# Patient Record
Sex: Female | Born: 1960 | Race: White | Hispanic: No | Marital: Married | Smoking: Never smoker
Health system: Southern US, Community
[De-identification: ages and names within clinical notes are randomized; demographics above are authoritative.]

## PROBLEM LIST (undated history)

## (undated) DIAGNOSIS — M5430 Sciatica, unspecified side: Secondary | ICD-10-CM

## (undated) DIAGNOSIS — E7800 Pure hypercholesterolemia, unspecified: Secondary | ICD-10-CM

## (undated) DIAGNOSIS — I10 Essential (primary) hypertension: Secondary | ICD-10-CM

## (undated) DIAGNOSIS — C801 Malignant (primary) neoplasm, unspecified: Secondary | ICD-10-CM

## (undated) DIAGNOSIS — E119 Type 2 diabetes mellitus without complications: Secondary | ICD-10-CM

## (undated) HISTORY — PX: ABDOMINAL HYSTERECTOMY: SHX81

## (undated) HISTORY — PX: LIVER SURGERY: SHX698

## (undated) HISTORY — PX: COLON SURGERY: SHX602

## (undated) HISTORY — PX: COLOSTOMY: SHX63

---

## 2002-01-02 ENCOUNTER — Emergency Department (HOSPITAL_COMMUNITY): Admission: EM | Admit: 2002-01-02 | Discharge: 2002-01-03 | Payer: Self-pay

## 2002-06-13 ENCOUNTER — Ambulatory Visit (HOSPITAL_COMMUNITY): Admission: RE | Admit: 2002-06-13 | Discharge: 2002-06-13 | Payer: Self-pay | Admitting: Internal Medicine

## 2002-06-13 ENCOUNTER — Encounter: Payer: Self-pay | Admitting: Internal Medicine

## 2004-01-09 ENCOUNTER — Ambulatory Visit (HOSPITAL_COMMUNITY): Admission: RE | Admit: 2004-01-09 | Discharge: 2004-01-09 | Payer: Self-pay | Admitting: Family Medicine

## 2006-03-24 ENCOUNTER — Ambulatory Visit (HOSPITAL_COMMUNITY): Admission: RE | Admit: 2006-03-24 | Discharge: 2006-03-24 | Payer: Self-pay | Admitting: Family Medicine

## 2007-06-02 ENCOUNTER — Ambulatory Visit (HOSPITAL_COMMUNITY): Admission: RE | Admit: 2007-06-02 | Discharge: 2007-06-02 | Payer: Self-pay | Admitting: Family Medicine

## 2008-09-07 IMAGING — CT CT ABDOMEN WO/W CM
2 of 5 series · 11 of 32 positions shown, 17 images · IV contrast (Omnipaque 300)
Comparison: CT 01/09/2004

CLINICAL DATA: Elevated LFTs.  History colon cancer with liver
metastasis.

CT ABDOMEN WITHOUT AND WITH CONTRAST,CT PELVIS WITH CONTRAST
TECHNIQUE: Multidetector CT imaging of the abdomen was performed
following the standard protocol before and during bolus
administration of intravenous contrast.,Technique:  Multidetector
CT imaging of the pelvis was performed following the standard proto
Contrast: 100 ml Hmnipaque-FPP

[Series 4: venous 60 sec 3.0 b40f · axial · portal-venous · 0.92mm/px · z∈[-472,-56]mm · 9 of 175 slices shown, 15 images]
[im 18/175  soft-tissue]
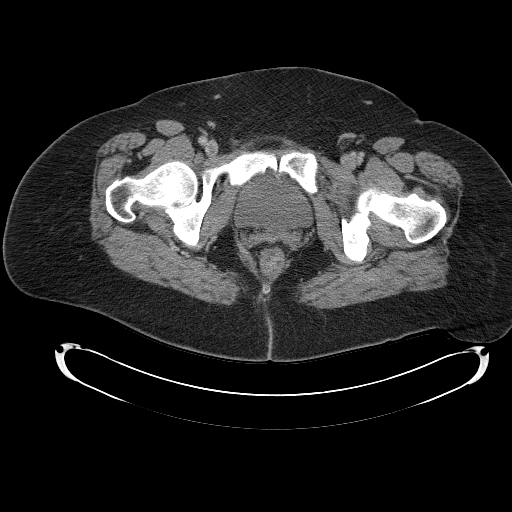
[im 18/175  bone]
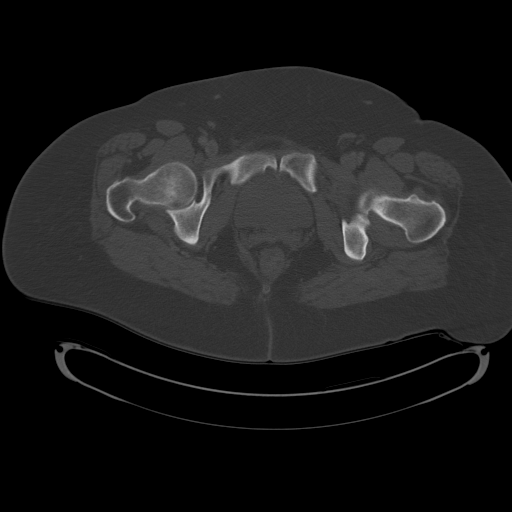
[im 35/175  soft-tissue]
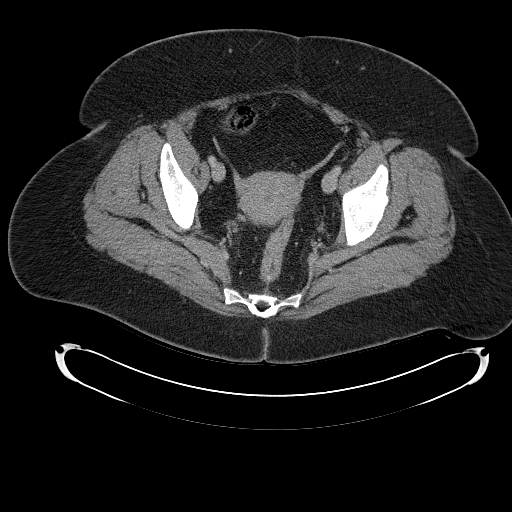
[im 53/175  soft-tissue]
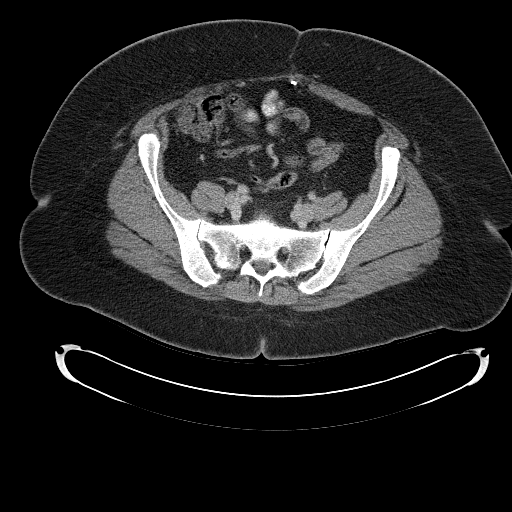
[im 70/175  soft-tissue]
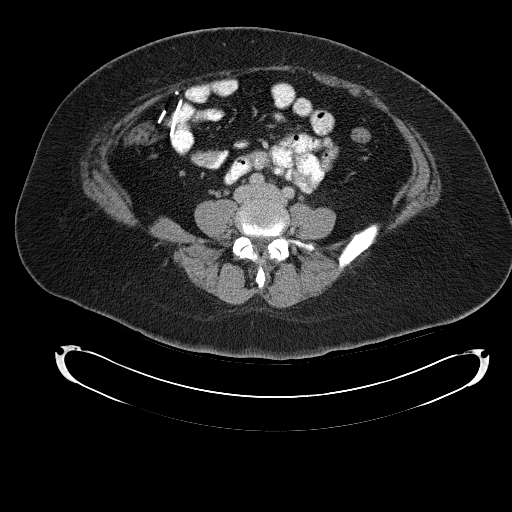
[im 88/175  soft-tissue]
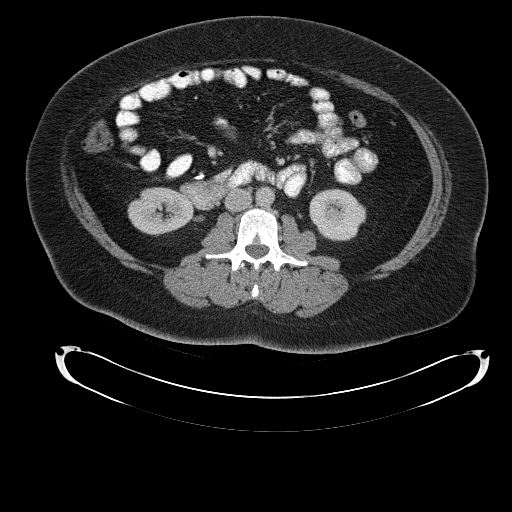
[im 105/175  soft-tissue]
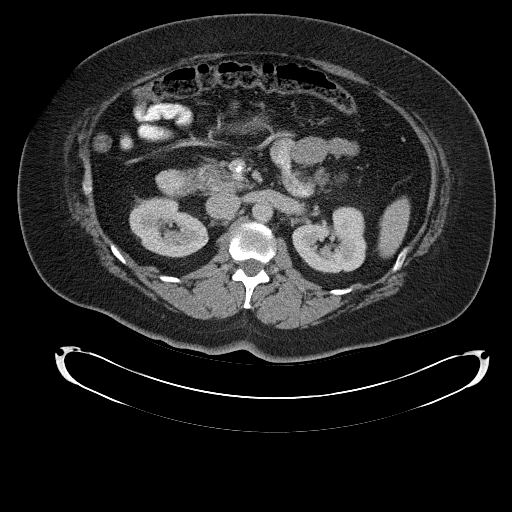
[im 105/175  lung]
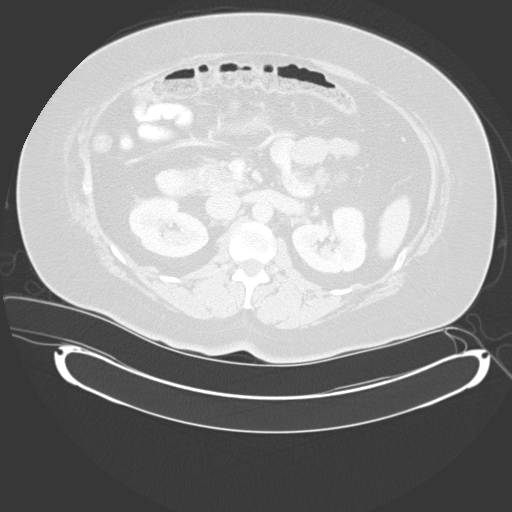
[im 122/175  soft-tissue]
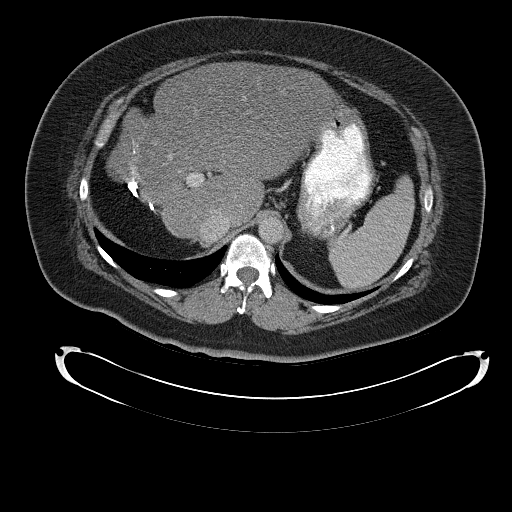
[im 122/175  lung]
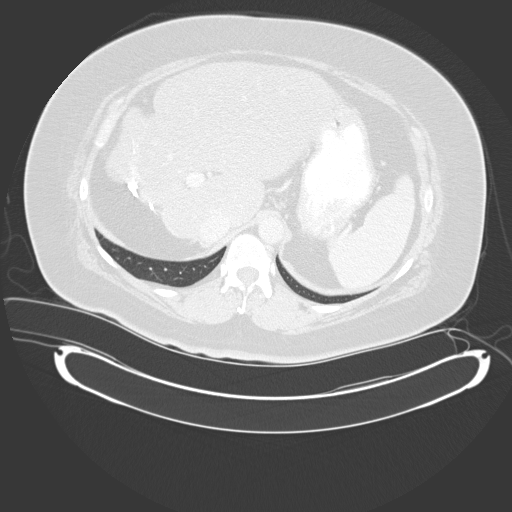
[im 140/175  soft-tissue]
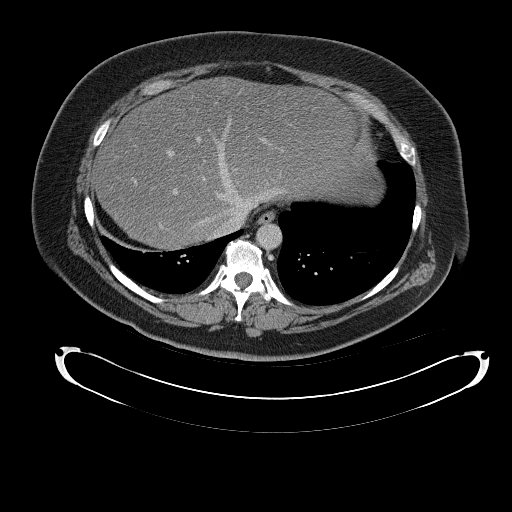
[im 140/175  lung]
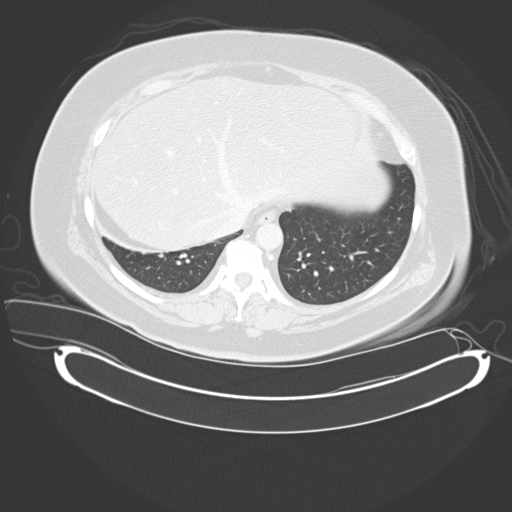
[im 157/175  soft-tissue]
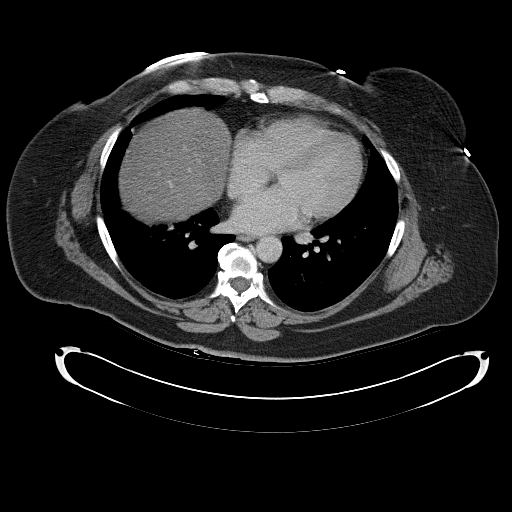
[im 157/175  lung]
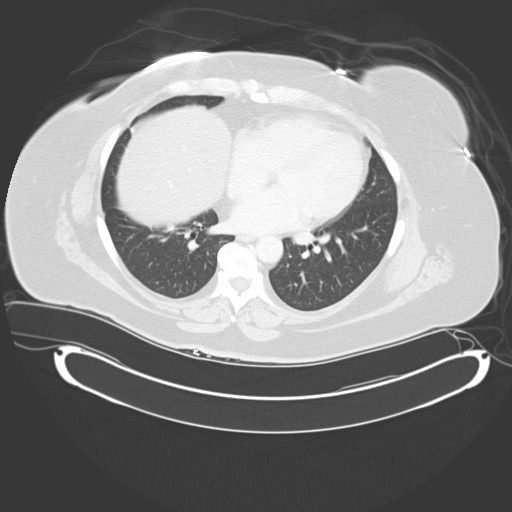
[im 157/175  bone]
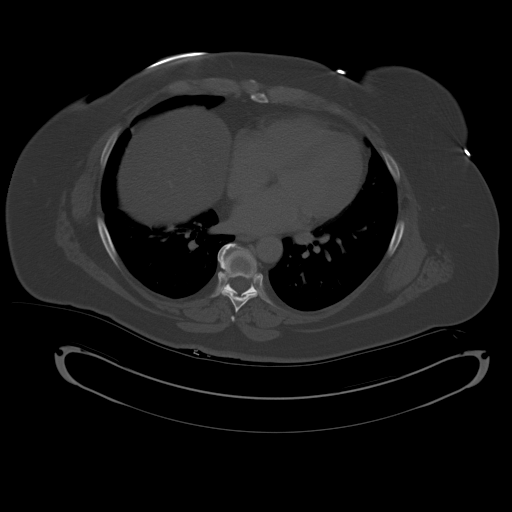

[Series 5: lung · axial · 0.92mm/px · z∈[-130,-68]mm · 2 of 65 slices shown]
[im 22/65  bone]
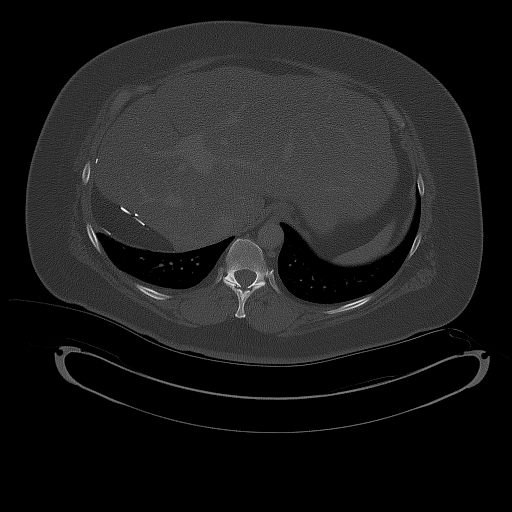
[im 43/65  bone]
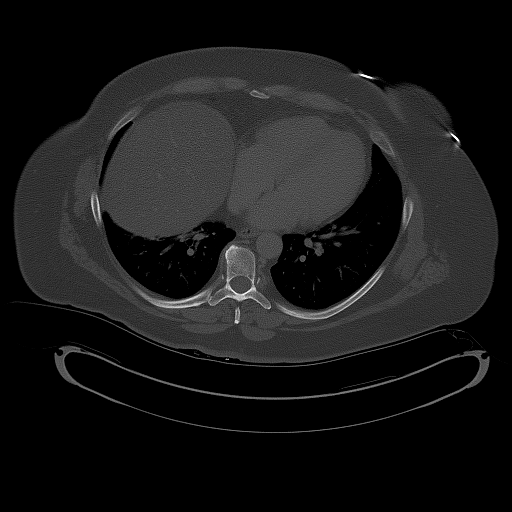

[11 of 32 positions shown; findings below may reference images not displayed]

FINDINGS: Postsurgical changes secondary to partial liver resection
redemonstrated.  No hepatic lesions are identified.  There is no
periportal or mesenteric adenopathy.  A biliary drainage catheter
is identified.  The pancreas is normal.  The spleen has a normal
appearance.  The kidneys show symmetric uptake and excretion of
contrast without abnormality.  The small and large bowel is
unremarkable.

The lung bases are clear.  No pericardial or pleural effusion.  The
osseous structures are unremarkable

CT pelvis:  Uterus is normal for age.  The ovaries are not
identified.  No pelvic adenopathy.  Sigmoid colon and rectum
unremarkable
IMPRESSION: 1.  CT abdomen: stable.  There is no evidence for hepatic
metastasis.  No adenopathy.

2.  CT pelvis:  Negative for metastatic disease.

## 2010-03-16 ENCOUNTER — Encounter: Payer: Self-pay | Admitting: Family Medicine

## 2012-01-02 ENCOUNTER — Other Ambulatory Visit: Payer: Self-pay | Admitting: Family Medicine

## 2012-01-02 DIAGNOSIS — Z1231 Encounter for screening mammogram for malignant neoplasm of breast: Secondary | ICD-10-CM

## 2012-01-02 DIAGNOSIS — Z780 Asymptomatic menopausal state: Secondary | ICD-10-CM

## 2012-02-11 ENCOUNTER — Ambulatory Visit: Payer: Self-pay

## 2012-02-11 ENCOUNTER — Other Ambulatory Visit: Payer: Self-pay

## 2012-06-01 ENCOUNTER — Other Ambulatory Visit: Payer: Self-pay

## 2012-06-01 ENCOUNTER — Ambulatory Visit: Payer: Self-pay

## 2013-04-14 ENCOUNTER — Encounter (HOSPITAL_COMMUNITY): Payer: Self-pay | Admitting: Emergency Medicine

## 2013-04-14 ENCOUNTER — Emergency Department (HOSPITAL_COMMUNITY)
Admission: EM | Admit: 2013-04-14 | Discharge: 2013-04-14 | Disposition: A | Discharge status: Home / Self Care | Payer: Self-pay | Attending: Emergency Medicine | Admitting: Emergency Medicine

## 2013-04-14 DIAGNOSIS — I10 Essential (primary) hypertension: Secondary | ICD-10-CM | POA: Insufficient documentation

## 2013-04-14 DIAGNOSIS — E119 Type 2 diabetes mellitus without complications: Secondary | ICD-10-CM | POA: Insufficient documentation

## 2013-04-14 DIAGNOSIS — M5430 Sciatica, unspecified side: Secondary | ICD-10-CM | POA: Insufficient documentation

## 2013-04-14 DIAGNOSIS — R739 Hyperglycemia, unspecified: Secondary | ICD-10-CM

## 2013-04-14 DIAGNOSIS — M545 Low back pain, unspecified: Secondary | ICD-10-CM | POA: Insufficient documentation

## 2013-04-14 DIAGNOSIS — Z859 Personal history of malignant neoplasm, unspecified: Secondary | ICD-10-CM | POA: Insufficient documentation

## 2013-04-14 HISTORY — DX: Malignant (primary) neoplasm, unspecified: C80.1

## 2013-04-14 HISTORY — DX: Type 2 diabetes mellitus without complications: E11.9

## 2013-04-14 HISTORY — DX: Essential (primary) hypertension: I10

## 2013-04-14 HISTORY — DX: Pure hypercholesterolemia, unspecified: E78.00

## 2013-04-14 LAB — CBG MONITORING, ED: GLUCOSE-CAPILLARY: 265 mg/dL — AB (ref 70–99)

## 2013-04-14 MED ORDER — KETOROLAC TROMETHAMINE 10 MG PO TABS
10.0000 mg | ORAL_TABLET | Freq: Once | ORAL | Status: AC
  Administered 2013-04-14: 10 mg via ORAL
  Filled 2013-04-14: qty 1

## 2013-04-14 MED ORDER — HYDROCODONE-ACETAMINOPHEN 5-325 MG PO TABS
1.0000 | ORAL_TABLET | Freq: Once | ORAL | Status: AC
  Administered 2013-04-14: 1 via ORAL
  Filled 2013-04-14: qty 1

## 2013-04-14 MED ORDER — METHYLPREDNISOLONE SODIUM SUCC 125 MG IJ SOLR: 125.0000 mg | Freq: Once | INTRAMUSCULAR | Status: DC

## 2013-04-14 MED ORDER — METHYLPREDNISOLONE SODIUM SUCC 125 MG IJ SOLR
125.0000 mg | Freq: Once | INTRAMUSCULAR | Status: AC
  Administered 2013-04-14: 125 mg via INTRAMUSCULAR
  Filled 2013-04-14: qty 2

## 2013-04-14 MED ORDER — DIAZEPAM 5 MG PO TABS
5.0000 mg | ORAL_TABLET | Freq: Once | ORAL | Status: AC
  Administered 2013-04-14: 5 mg via ORAL
  Filled 2013-04-14: qty 1

## 2013-04-14 MED ORDER — BACLOFEN 10 MG PO TABS: 10.0000 mg | ORAL_TABLET | Freq: Three times a day (TID) | ORAL | Status: AC

## 2013-04-14 MED ORDER — HYDROCODONE-ACETAMINOPHEN 5-325 MG PO TABS: 1.0000 | ORAL_TABLET | ORAL | Status: DC | PRN

## 2013-04-14 MED ORDER — ONDANSETRON HCL 4 MG PO TABS
4.0000 mg | ORAL_TABLET | Freq: Once | ORAL | Status: AC
  Administered 2013-04-14: 4 mg via ORAL
  Filled 2013-04-14: qty 1

## 2013-04-14 MED ORDER — MELOXICAM 7.5 MG PO TABS: ORAL_TABLET | ORAL | Status: AC

## 2013-04-14 NOTE — ED Provider Notes (Signed)
CSN: [id number]     Arrival date & time 04/14/13  1921 History   First MD Initiated Contact with Patient 04/14/13 1951     Chief Complaint  Patient presents with  . Leg Pain     (Consider location/radiation/quality/duration/timing/severity/associated sxs/prior Treatment) HPI Comments: Patient is a 53 year old female who presents to the emergency apartment with complaint of back pain and leg pain. The patient states that she is been diagnosed with" sciatica" by clinical examination for quite some time. 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 Over the last 1-1/2 weeks the pain has gotten progressively worse. In in the last 3 days it has gotten almost unbearable. The patient has problems controlling her bowels and bladder following: Surgery. She has not had any new changes in control of her bowels and bladder in this 1-1/2 week period.. The patient has not had any fall or injury to the back or legs. The patient has been given a list of physical therapy type exercises to do, but has not been sent to physical therapy, and according to the patient has not had x-rays or MRIs concerning her back.  Patient is a 53 y.o. female presenting with leg pain. The history is provided by the patient.  Leg Pain Associated symptoms: back pain   Associated symptoms: no neck pain     Past Medical History  Diagnosis Date  . Cancer   . Hypertension   . Diabetes mellitus without complication   . High cholesterol    Past Surgical History  Procedure Laterality Date  . Colon surgery    . Colostomy    . Liver surgery     No family history on file. History  Substance Use Topics  . Smoking status: Never Smoker   . Smokeless tobacco: Not on file  . Alcohol Use: No   OB History   Grav Para Term Preterm Abortions TAB SAB Ect Mult Living                 Review of Systems  Constitutional: Negative for activity change.       All ROS Neg  except as noted in HPI  HENT: Negative for nosebleeds.   Eyes: Negative for photophobia and discharge.  Respiratory: Negative for cough, shortness of breath and wheezing.   Cardiovascular: Negative for chest pain and palpitations.  Gastrointestinal: Negative for abdominal pain and blood in stool.  Genitourinary: Negative for dysuria, frequency and hematuria.  Musculoskeletal: Positive for back pain. Negative for arthralgias and neck pain.  Skin: Negative.   Neurological: Negative for dizziness, seizures and speech difficulty.  Psychiatric/Behavioral: Negative for hallucinations and confusion.      Allergies  Iohexol  Home Medications  No current outpatient prescriptions on file. BP 143/79  Pulse 73  Temp(Src) 98.2 F (36.8 C) (Oral)  Resp 18  Ht 5\' 1"  (1.549 m)  Wt 206 lb (93.441 kg)  BMI 38.94 kg/m2  SpO2 98% Physical Exam  Nursing note and vitals reviewed. Constitutional: She is oriented to person, place, and time. She appears well-developed and well-nourished.  Non-toxic appearance.  HENT:  Head: Normocephalic.  Right Ear: Tympanic membrane and external ear normal.  Left Ear: Tympanic membrane and external ear normal.  Eyes: EOM and lids are normal. Pupils are equal, round, and reactive to light.  Neck: Normal range of motion. Neck supple. Carotid bruit is not present.  Cardiovascular: Normal rate, regular rhythm, normal heart  sounds, intact distal pulses and normal pulses.   Pulmonary/Chest: Breath sounds normal. No respiratory distress.  Abdominal: Soft. Bowel sounds are normal. There is no tenderness. There is no guarding.  Musculoskeletal: Normal range of motion.  There is left paraspinal area tenderness to palpation and range of motion. There is no palpable step off appreciated of the lumbar spine area. No hot areas. Back pain aggravated by leg raises.  Lymphadenopathy:       Head (right side): No submandibular adenopathy present.       Head (left side): No  submandibular adenopathy present.    She has no cervical adenopathy.  Neurological: She is alert and oriented to person, place, and time. She has normal strength. No cranial nerve deficit or sensory deficit.  Gait is steady. Pt walks with a limp, but No foot drop.   Skin: Skin is warm and dry.  Psychiatric: She has a normal mood and affect. Her speech is normal.    ED Course  Procedures (including critical care time) Labs Review Labs Reviewed  CBG MONITORING, ED - Abnormal; Notable for the following:    Glucose-Capillary 265 (*)    All other components within normal limits   Imaging Review No results found.  EKG Interpretation   None       MDM Patient has a long-term history of back problems. She has been told by clinical examination that she has sciatica. The patient has been doing physical therapy exercises that she was given to use but these have not been successful. She has tried ibuprofen and this too has not been successful. The patient states now the pain is keeping her up at night and getting progressively worse. She attempted to contact her physician today, but did not get a call back.    Final diagnoses:  None    **I have reviewed nursing notes, vital signs, and all appropriate lab and imaging results for this patient.*  The plan at this time is for the patient to be treated with Norco, Mobic, Robaxin. Patient advised to apply heat to her lower back. She is to see her primary physician as sone as possible for additional evaluation and management of her lower back problems.  Lenox Ahr, PA-C 04/14/13 2031

## 2013-04-14 NOTE — ED Notes (Signed)
Pt also reports numbness in the toes of her L foot, denies same on R. EDPa informed of pt's CBG.

## 2013-04-14 NOTE — ED Notes (Signed)
Patient c/o left leg pain for about 1 1/2 weeks; states has gotten worse in the last 3 days.  Patient states she has history of sciatica, but everything she usually does for sciatica is not working.  Patient ambulatory to triage.

## 2013-04-14 NOTE — Discharge Instructions (Signed)
Please call Dr. Melford Aase tomorrow to set up an appointment as soon  as possible. Your glucose is elevated. Please discuss this with your doctor. It will be helpful to you to discuss your workup concerning your back problem with your Dr. This will also help the MD help you  with pain management. Please apply heat to your back. Please rest your back is much as possible. Please take your medications as prescribed. Baclofen and Norco may cause drowsiness, please use with caution. Back Pain, Adult Low back pain is very common. About 1 in 5 people have back pain.The cause of low back pain is rarely dangerous. The pain often gets better over time.About half of people with a sudden onset of back pain feel better in just 2 weeks. About 8 in 10 people feel better by 6 weeks.  CAUSES Some common causes of back pain include:  Strain of the muscles or ligaments supporting the spine.  Wear and tear (degeneration) of the spinal discs.  Arthritis.  Direct injury to the back. DIAGNOSIS Most of the time, the direct cause of low back pain is not known.However, back pain can be treated effectively even when the exact cause of the pain is unknown.Answering your caregiver's questions about your overall health and symptoms is one of the most accurate ways to make sure the cause of your pain is not dangerous. If your caregiver needs more information, he or she may order lab work or imaging tests (X-rays or MRIs).However, even if imaging tests show changes in your back, this usually does not require surgery. HOME CARE INSTRUCTIONS For many people, back pain returns.Since low back pain is rarely dangerous, it is often a condition that people can learn to [facility] their own.   Remain active. It is stressful on the back to sit or stand in one place. Do not sit, drive, or stand in one place for more than 30 minutes at a time. Take short walks on level surfaces as soon as pain allows.Try to increase the length of time you  walk each day.  Do not stay in bed.Resting more than 1 or 2 days can delay your recovery.  Do not avoid exercise or work.Your body is made to move.It is not dangerous to be active, even though your back may hurt.Your back will likely heal faster if you return to being active before your pain is gone.  Pay attention to your body when you bend and lift. Many people have less discomfortwhen lifting if they bend their knees, keep the load close to their bodies,and avoid twisting. Often, the most comfortable positions are those that put less stress on your recovering back.  Find a comfortable position to sleep. Use a firm mattress and lie on your side with your knees slightly bent. If you lie on your back, put a pillow under your knees.  Only take over-the-counter or prescription medicines as directed by your caregiver. Over-the-counter medicines to reduce pain and inflammation are often the most helpful.Your caregiver may prescribe muscle relaxant drugs.These medicines help dull your pain so you can more quickly return to your normal activities and healthy exercise.  Put ice on the injured area.  Put ice in a plastic bag.  Place a towel between your skin and the bag.  Leave the ice on for 15-20 minutes, 03-04 times a day for the first 2 to 3 days. After that, ice and heat may be alternated to reduce pain and spasms.  Ask your caregiver about trying back  exercises and gentle massage. This may be of some benefit.  Avoid feeling anxious or stressed.Stress increases muscle tension and can worsen back pain.It is important to recognize when you are anxious or stressed and learn ways to manage it.Exercise is a great option. SEEK MEDICAL CARE IF:  You have pain that is not relieved with rest or medicine.  You have pain that does not improve in 1 week.  You have new symptoms.  You are generally not feeling well. SEEK IMMEDIATE MEDICAL CARE IF:   You have pain that radiates from your  back into your legs.  You develop new bowel or bladder control problems.  You have unusual weakness or numbness in your arms or legs.  You develop nausea or vomiting.  You develop abdominal pain.  You feel faint. Document Released: 02/10/2005 Document Revised: 08/12/2011 Document Reviewed: 07/01/2010 [facility] Patient Information 2014 [place], [place].

## 2013-04-15 NOTE — ED Provider Notes (Signed)
Medical screening examination/treatment/procedure(s) were performed by non-physician practitioner and as supervising physician I was immediately available for consultation/collaboration.  EKG Interpretation   None        Jasper Riling. Alvino Chapel, MD 04/15/13 6578

## 2015-12-20 ENCOUNTER — Encounter: Payer: Self-pay | Admitting: Nurse Practitioner

## 2017-01-15 ENCOUNTER — Encounter (HOSPITAL_COMMUNITY): Payer: Self-pay

## 2017-01-15 ENCOUNTER — Emergency Department (HOSPITAL_COMMUNITY)
Admission: EM | Admit: 2017-01-15 | Discharge: 2017-01-15 | Disposition: A | Discharge status: Home / Self Care | Payer: BLUE CROSS/BLUE SHIELD | Attending: Emergency Medicine | Admitting: Emergency Medicine

## 2017-01-15 ENCOUNTER — Other Ambulatory Visit: Payer: Self-pay

## 2017-01-15 DIAGNOSIS — Z7984 Long term (current) use of oral hypoglycemic drugs: Secondary | ICD-10-CM | POA: Insufficient documentation

## 2017-01-15 DIAGNOSIS — I10 Essential (primary) hypertension: Secondary | ICD-10-CM | POA: Insufficient documentation

## 2017-01-15 DIAGNOSIS — M5431 Sciatica, right side: Secondary | ICD-10-CM | POA: Diagnosis not present

## 2017-01-15 DIAGNOSIS — Z79899 Other long term (current) drug therapy: Secondary | ICD-10-CM | POA: Diagnosis not present

## 2017-01-15 DIAGNOSIS — E119 Type 2 diabetes mellitus without complications: Secondary | ICD-10-CM | POA: Diagnosis not present

## 2017-01-15 DIAGNOSIS — M79604 Pain in right leg: Secondary | ICD-10-CM | POA: Diagnosis present

## 2017-01-15 MED ORDER — METHYLPREDNISOLONE 4 MG PO TBPK: ORAL_TABLET | ORAL | 0 refills | Status: DC

## 2017-01-15 MED ORDER — HYDROMORPHONE HCL 1 MG/ML IJ SOLN
1.0000 mg | Freq: Once | INTRAMUSCULAR | Status: AC
  Administered 2017-01-15: 1 mg via INTRAMUSCULAR
  Filled 2017-01-15: qty 1

## 2017-01-15 MED ORDER — DIAZEPAM 5 MG PO TABS
5.0000 mg | ORAL_TABLET | Freq: Once | ORAL | Status: AC
  Administered 2017-01-15: 5 mg via ORAL
  Filled 2017-01-15: qty 1

## 2017-01-15 MED ORDER — KETOROLAC TROMETHAMINE 30 MG/ML IJ SOLN
60.0000 mg | Freq: Once | INTRAMUSCULAR | Status: AC
Start: 2017-01-15 — End: 2017-01-15
  Administered 2017-01-15: 60 mg via INTRAMUSCULAR
  Filled 2017-01-15: qty 2

## 2017-01-15 MED ORDER — METHOCARBAMOL 500 MG PO TABS: 500.0000 mg | ORAL_TABLET | Freq: Two times a day (BID) | ORAL | 0 refills | Status: DC

## 2017-01-15 NOTE — ED Notes (Signed)
Pt stated that her pain meds are not touching the pain.

## 2017-01-15 NOTE — Discharge Instructions (Signed)
Take your pain medicine as prescribed.  Monitor blood sugars closely while you are taking the steroids.  Follow-up with your primary doctor.  He likely should have an MRI of your lumbar spine.  Return to the ED if you develop new or worsening symptoms including weakness, numbness or incontinence or any other concerns

## 2017-01-15 NOTE — ED Notes (Signed)
Pt has a driver at bedside for transportation. Pt verbalizing understanding that driver is needed to be discharged d/t valium and medication. Driver verbalizing that he will drive.

## 2017-01-15 NOTE — ED Provider Notes (Signed)
[facility] EMERGENCY DEPARTMENT Provider Note   CSN: [id number] Arrival date & time: 01/15/17  7253     History   Chief Complaint Chief Complaint  Patient presents with  . Sciatica    HPI Maria Luna is a 56 y.o. female.  Patient presents with flare of her chronic "sciatica pain".  States she has chronic pain in her right buttock that radiates down her right leg for which she takes hydrocodone 4 times a day.  She has been on this medication for several years.  She reports over the past 2 weeks the pain is gotten more intense and uncontrolled by her chronic pain medication.  She denies any fall or trauma.  She has never had an MRI of her back or any back surgery.  She rates pain in her right buttock that radiates down her right leg associated with numbness and tingling.  She has weakness in her leg due to pain.  No bowel or bladder incontinence.  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  Remote history of colon cancer that she is cancer free at this time.  Denies fever or vomiting.  Denies chest pain or shortness of breath.   The history is provided by the patient and the spouse.    Past Medical History:  Diagnosis Date  . Cancer ([place])   . Diabetes mellitus without complication ([place])   . High cholesterol   . Hypertension     There are no active problems to display for this patient.   Past Surgical History:  Procedure Laterality Date  . COLON SURGERY    . COLOSTOMY    . LIVER SURGERY      OB History    No data available       Home Medications    Prior to Admission medications   Medication Sig Start Date End Date Taking? Authorizing Provider  amphetamine-dextroamphetamine (ADDERALL) 10 MG tablet Take 10 mg by mouth 4 (four) times daily. 03/24/13  Yes [provider]  HYDROcodone-acetaminophen (NORCO) 5-325 MG per tablet Take 1 tablet by mouth every 4 (four) hours as needed for moderate pain. 04/14/13  Yes Lily Kocher,  PA-C  lisinopril (PRINIVIL,ZESTRIL) 10 MG tablet Take 10 mg by mouth daily. 03/23/13  Yes [provider]  meloxicam (MOBIC) 7.5 MG tablet 1 po bid with food 04/14/13  Yes Lily Kocher, PA-C  metFORMIN (GLUCOPHAGE-XR) 500 MG 24 hr tablet Take 1,000 mg by mouth 2 (two) times daily. 03/24/13  Yes [provider]  citalopram (CELEXA) 20 MG tablet Take 20 mg by mouth daily. 03/24/13   [provider]  HYDROcodone-acetaminophen (NORCO) 10-325 MG per tablet Take 1 tablet by mouth 4 (four) times daily. 03/25/13   [provider]    Family History History reviewed. No pertinent family history.  Social History Social History   Tobacco Use  . Smoking status: Never Smoker  . Smokeless tobacco: Never Used  Substance Use Topics  . Alcohol use: No  . Drug use: No     Allergies   Iohexol   Review of Systems Review of Systems  Constitutional: Negative for activity change, appetite change and fever.  HENT: Negative for congestion and nosebleeds.   Respiratory: Negative for cough, chest tightness and shortness of breath.   Cardiovascular: Negative for chest pain.  Gastrointestinal: Negative for abdominal pain, nausea and vomiting.  Genitourinary: Negative for dysuria and pelvic pain.  Musculoskeletal: Positive for arthralgias, back pain and myalgias.  Skin: Negative  for rash.  Neurological: Positive for weakness and numbness. Negative for dizziness, light-headedness and headaches.   all other systems are negative except as noted in the HPI and PMH.     Physical Exam Updated Vital Signs BP (!) 162/90 (BP Location: Left Arm)   Pulse 86   Temp 97.9 F (36.6 C) (Oral)   Resp 18   Ht 5\' 3"  (1.6 m)   Wt 93.4 kg (206 lb)   SpO2 100%   BMI 36.49 kg/m   Physical Exam  Constitutional: She is oriented to person, place, and time. She appears well-developed and well-nourished. No distress.  uncomfortable  HENT:  Head: Normocephalic and atraumatic.    Mouth/Throat: Oropharynx is clear and moist. No oropharyngeal exudate.  Eyes: Conjunctivae and EOM are normal. Pupils are equal, round, and reactive to light.  Neck: Normal range of motion. Neck supple.  No meningismus.  Cardiovascular: Normal rate, regular rhythm, normal heart sounds and intact distal pulses.  No murmur heard. Pulmonary/Chest: Effort normal and breath sounds normal. No respiratory distress.  Abdominal: Soft. There is no tenderness. There is no rebound and no guarding.  Musculoskeletal: Normal range of motion. She exhibits tenderness. She exhibits no edema.  R SI joint tenderness 5/5 strength in bilateral lower extremities. Ankle plantar and dorsiflexion intact. Great toe extension intact bilaterally. +2 DP and PT pulses. +2 patellar reflexes bilaterally. Normal gait. Pain with SLR on Right  Neurological: She is alert and oriented to person, place, and time. No cranial nerve deficit. She exhibits normal muscle tone. Coordination normal.   5/5 strength throughout. CN 2-12 intact.Equal grip strength.   Skin: Skin is warm. Capillary refill takes less than 2 seconds.  Psychiatric: She has a normal mood and affect. Her behavior is normal.  Nursing note and vitals reviewed.    ED Treatments / Results  Labs (all labs ordered are listed, but only abnormal results are displayed) Labs Reviewed - No data to display  EKG  EKG Interpretation None       Radiology No results found.  Procedures Procedures (including critical care time)  Medications Ordered in ED Medications  HYDROmorphone (DILAUDID) injection 1 mg (not administered)  ketorolac (TORADOL) 30 MG/ML injection 60 mg (not administered)     Initial Impression / Assessment and Plan / ED Course  I have reviewed the triage vital signs and the nursing notes.  Pertinent labs & imaging results that were available during my care of the patient were reviewed by me and considered in my medical decision making (see  chart for details).    Acute on chronic sciatica type pain without new trauma.  No neurological red flags.  No incontinence. No Focal weakness, numbness or tingling. On chronic pain meds at home.  Low suspicion for cord compression or cauda equina.  Patient's pain is improved with treatment in the ED.  She is able to ambulate.  She has chronic pain medication at home.  She will be prescribed a course of steroids and muscle relaxers.  Discussed to watch her blood sugars closely while she is taking steroids. Follow-up with PCP for MRI.  Return precautions discussed including weakness, numbness, incontinence, or any other concerns. Final Clinical Impressions(s) / ED Diagnoses   Final diagnoses:  Right sided sciatica    ED Discharge Orders    None       Jazman Reuter, Annie Main, MD 01/15/17 [phone]

## 2017-01-15 NOTE — ED Triage Notes (Signed)
Chronic issue but hurting worse this morning.

## 2017-01-15 NOTE — ED Notes (Signed)
Assuming care for discharge. Patient alert, oriented following all commands, and ambulates with a strong and steady gait. Patient's visitor at bedside with patient. Pt stating no pain at this time. Pt has ambulated around the nurse's desk with no difficulty. No dizziness, sob, or unsteady gait.

## 2017-09-18 ENCOUNTER — Ambulatory Visit (INDEPENDENT_AMBULATORY_CARE_PROVIDER_SITE_OTHER): Payer: BLUE CROSS/BLUE SHIELD | Admitting: Internal Medicine

## 2017-09-18 DIAGNOSIS — Z9189 Other specified personal risk factors, not elsewhere classified: Secondary | ICD-10-CM

## 2017-09-18 MED ORDER — ATOVAQUONE-PROGUANIL HCL 250-100 MG PO TABS: 1.0000 | ORAL_TABLET | Freq: Every day | ORAL | 0 refills | Status: DC

## 2017-09-18 MED ORDER — AZITHROMYCIN 500 MG PO TABS: 500.0000 mg | ORAL_TABLET | Freq: Every day | ORAL | 0 refills | Status: DC

## 2017-09-18 NOTE — Progress Notes (Signed)
  Subjective:   Maria Luna is a 56 y.o. female who presents to the Infectious Disease clinic for travel consultation. Planned departure date: sep 12       Planned return date: sep 29 Countries of travel: [place] Areas in country: urban Accommodations: guesthouse Purpose of travel: medical mission, but also traveling with a church friend Prior travel out of [place]: none     Objective:   Medications: Metformin Lisinopril zoloft zocor Hydrocodone prn Lomotil prn  Pmhx: DM HLd HTN Sciatica depression    Assessment:   No contraindications to travel. none   Plan:   Pre travel vaccines = she will require to have yellow fever prior to travel. Also recommend hepatitis A and typhoid vaccine -defer at this time  Malaria prophylaixis= gave rx for malarone, and mosquito bite prevention  Traveler's diarrhea = gave rx for azithromycin to use if needed

## 2017-09-18 NOTE — Patient Instructions (Addendum)
[facility]                [address], [place]                   [place],  [address]                      Phone: [phone]                        Fax: [phone]   Planned departure date: sep 12          Planned return date: sep 29 Countries of travel: [place]  Guidelines for the Prevention & Treatment of Traveler's Diarrhea  Prevention: "Boil it, Peel it, Cook it, or Forget it"   the fewer chances -> lower risk: try to stick to food & water precautions as much as possible"   If it's "piping hot"; it is probably okay, if not, it may not be   Treatment   1) You should always take care to drink lots of fluids in order to avoid dehydration   2) You should bring medications with you in case you come down with a case of diarrhea   3) OTC = bring pepto-bismol - can take with initial abdominal symptoms;                    Imodium - can help slow down your intestinal tract, can help relief cramps                    and diarrhea, can take if no bloody diarrhea  Use azithromycin if needed for traveler's diarrhea  Guidelines for the Prevention of Malaria  Avoidance:  -fewer mosquito bites = lower risk. Mosquitos can bite at night as well as daytime  -cover up (long sleeve clothing), mosquito nets, screens  -Insect repellent for your skin ( DEET containing lotion > 20%): for clothes ( permethrin spray)   On sep 10th start malarone for malaria prevention.   Immunizations received today:  Future immunizations, if indicated: you will need to get your yellow fever vaccine, no later than sept 2nd. Before your trip  Prior to travel:  1) Be sure to pick up appropriate prescriptions, including medicine you take daily. Do not expect to be able to fill your prescriptions abroad.  2) Strongly consider obtaining traveler's insurance, including emergency evacuation insurance. Most plans in the [place] do not cover participants abroad.  (see below for resources)  3) Register at the appropriate [place] embassy or consulate with travel dates so they are aware of your presence in-country and for helpful advice during travel using the [employer] (STEP, GreenNylon.com.cy).  4) Leave contact information with a relative or friend.  5) Keep a Research officer, political party, credit cards in case they become lost or stolen  6) Inform your credit card company that you will be travelling abroad   During travel:  1) If you become ill and need medical advice, the [place] [employer]	[place] of the country you are traveling in general provides a list of [place] speaking doctors.  We are also available on MyChart for remote consultation if you register prior to travel. 2) Avoid motorcycles or scooters when at all possible. Traffic laws in many countries are lax and accidents occur frequently.  3) Do not take any unnecessary risks that you wouldn't  do at home.   Resources:  -Country specific information: BlindResource.ca or GreenNylon.com.cy  -Press photographer (DEET, mosquito nets): [employer], [employer] store, [employer], [place] insurance options: gatewayplans.com; http://clayton-rivera.info/; travelguard.com or [employer], gninsurance.com or info@gninsurance .com, [id number].   Post Travel:  If you return from your trip ill, call your primary care doctor or our travel clinic @ [phone].   Enjoy your trip and know that with proper pre-travel preparation, most people have an enjoyable and uninterrupted trip!

## 2018-07-30 ENCOUNTER — Encounter (HOSPITAL_COMMUNITY): Payer: Self-pay | Admitting: Emergency Medicine

## 2018-07-30 ENCOUNTER — Other Ambulatory Visit: Payer: Self-pay

## 2018-07-30 ENCOUNTER — Emergency Department (HOSPITAL_COMMUNITY)
Admission: EM | Admit: 2018-07-30 | Discharge: 2018-07-30 | Disposition: A | Discharge status: Home / Self Care | Payer: BLUE CROSS/BLUE SHIELD | Attending: Emergency Medicine | Admitting: Emergency Medicine

## 2018-07-30 DIAGNOSIS — M5431 Sciatica, right side: Secondary | ICD-10-CM

## 2018-07-30 DIAGNOSIS — Z7984 Long term (current) use of oral hypoglycemic drugs: Secondary | ICD-10-CM | POA: Insufficient documentation

## 2018-07-30 DIAGNOSIS — Z79899 Other long term (current) drug therapy: Secondary | ICD-10-CM | POA: Insufficient documentation

## 2018-07-30 DIAGNOSIS — I10 Essential (primary) hypertension: Secondary | ICD-10-CM | POA: Insufficient documentation

## 2018-07-30 DIAGNOSIS — Z859 Personal history of malignant neoplasm, unspecified: Secondary | ICD-10-CM | POA: Insufficient documentation

## 2018-07-30 DIAGNOSIS — E119 Type 2 diabetes mellitus without complications: Secondary | ICD-10-CM | POA: Insufficient documentation

## 2018-07-30 MED ORDER — HYDROMORPHONE HCL 1 MG/ML IJ SOLN
1.0000 mg | Freq: Once | INTRAMUSCULAR | Status: AC
  Administered 2018-07-30: 1 mg via INTRAMUSCULAR
  Filled 2018-07-30: qty 1

## 2018-07-30 MED ORDER — PREDNISONE 10 MG PO TABS: ORAL_TABLET | ORAL | 0 refills | Status: DC

## 2018-07-30 MED ORDER — CYCLOBENZAPRINE HCL 10 MG PO TABS
10.0000 mg | ORAL_TABLET | Freq: Once | ORAL | Status: AC
  Administered 2018-07-30: 10 mg via ORAL
  Filled 2018-07-30: qty 1

## 2018-07-30 MED ORDER — CYCLOBENZAPRINE HCL 10 MG PO TABS: 10.0000 mg | ORAL_TABLET | Freq: Three times a day (TID) | ORAL | 0 refills | Status: DC | PRN

## 2018-07-30 MED ORDER — HYDROCODONE-ACETAMINOPHEN 7.5-325 MG PO TABS: 1.0000 | ORAL_TABLET | Freq: Four times a day (QID) | ORAL | 0 refills | Status: DC | PRN

## 2018-07-30 NOTE — ED Triage Notes (Signed)
Patient reports R sided back pain with radiation down her leg. Chronic with flare-up beginning the first of May. Patient states pain has become much worse over the past 3 days.

## 2018-07-30 NOTE — ED Provider Notes (Signed)
[facility] EMERGENCY DEPARTMENT Provider Note   CSN: [id number] Arrival date & time: 07/30/18  1245    History   Chief Complaint Chief Complaint  Patient presents with   Back Pain    HPI Maria Luna is a 58 y.o. female.     HPI   Maria Luna is a 58 y.o. female who presents to the Emergency Department complaining of recurrent pain of her right low back and hip.  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  She describes a sharp stabbing type pain from her right buttock that radiates into the back of her right thigh down to the level just below her knee.  Pain is worse with movement and improves somewhat at rest in the fetal position.  She states that her primary care provider treated her for this a few weeks ago and symptoms temporarily improved but returned after picking up bags of groceries.  She denies known fall.  She states her current symptoms are similar to previous.  She denies abdominal pain, urine or bowel changes, numbness or weakness of her lower extremities, fever or chills.  She states she has taken Mobic in the past for this, but recently this has not helped her symptoms.   Past Medical History:  Diagnosis Date   Cancer ([place])    Diabetes mellitus without complication ([place])    High cholesterol    Hypertension     There are no active problems to display for this patient.   Past Surgical History:  Procedure Laterality Date   ABDOMINAL HYSTERECTOMY     COLON SURGERY     COLOSTOMY     LIVER SURGERY       OB History   No obstetric history on file.      Home Medications    Prior to Admission medications   Medication Sig Start Date End Date Taking? Authorizing Provider  amphetamine-dextroamphetamine (ADDERALL) 10 MG tablet Take 10 mg by mouth 4 (four) times daily. 03/24/13   [provider]  atovaquone-proguanil (MALARONE) 250-100 MG TABS tablet Take 1 tablet by mouth daily. Start taking on  sept 10th on full stomach until complete 09/18/17   Carlyle Basques, MD  azithromycin (ZITHROMAX) 500 MG tablet Take 1 tablet (500 mg total) by mouth daily. If you have 3+ watery diarrhea/24hr. Can stop taking if diarrhea resolves 09/18/17   Carlyle Basques, MD  citalopram (CELEXA) 20 MG tablet Take 20 mg by mouth daily. 03/24/13   [provider]  HYDROcodone-acetaminophen (NORCO) 10-325 MG per tablet Take 1 tablet by mouth 4 (four) times daily. 03/25/13   [provider]  HYDROcodone-acetaminophen (NORCO) 5-325 MG per tablet Take 1 tablet by mouth every 4 (four) hours as needed for moderate pain. 04/14/13   Lily Kocher, PA-C  lisinopril (PRINIVIL,ZESTRIL) 10 MG tablet Take 10 mg by mouth daily. 03/23/13   [provider]  meloxicam (MOBIC) 7.5 MG tablet 1 po bid with food 04/14/13   Lily Kocher, PA-C  metFORMIN (GLUCOPHAGE-XR) 500 MG 24 hr tablet Take 1,000 mg by mouth 2 (two) times daily. 03/24/13   [provider]  methocarbamol (ROBAXIN) 500 MG tablet Take 1 tablet (500 mg total) by mouth 2 (two) times daily. 01/15/17   Rancour, Annie Main, MD  methylPREDNISolone (MEDROL DOSEPAK) 4 MG TBPK tablet As directed 01/15/17   Ezequiel Essex, MD    Family History No family history on file.  Social History Social History   Tobacco Use  Smoking status: Never Smoker   Smokeless tobacco: Never Used  Substance Use Topics   Alcohol use: No   Drug use: No     Allergies   Iohexol   Review of Systems Review of Systems  Constitutional: Negative for fever.  Respiratory: Negative for shortness of breath.   Cardiovascular: Negative for chest pain.  Gastrointestinal: Negative for abdominal pain, constipation, nausea and vomiting.  Genitourinary: Negative for decreased urine volume, difficulty urinating, dysuria, flank pain and hematuria.  Musculoskeletal: Positive for back pain. Negative for joint swelling.  Skin: Negative for rash.  Neurological: Negative  for weakness and numbness.     Physical Exam Updated Vital Signs BP (!) 112/100 (BP Location: Right Arm)    Pulse 68    Temp 97.9 F (36.6 C) (Oral)    Resp 16    Ht 5\' 2"  (1.575 m)    Wt 74.8 kg    SpO2 95%    BMI 30.18 kg/m   Physical Exam Vitals signs and nursing note reviewed.  Constitutional:      General: She is not in acute distress.    Appearance: Normal appearance. She is well-developed.  HENT:     Head: Normocephalic.  Neck:     Musculoskeletal: Normal range of motion and neck supple.  Cardiovascular:     Rate and Rhythm: Normal rate and regular rhythm.     Pulses: Normal pulses.     Comments: DP pulses are strong and palpable bilaterally Pulmonary:     Effort: Pulmonary effort is normal. No respiratory distress.     Breath sounds: Normal breath sounds.  Chest:     Chest wall: No tenderness.  Abdominal:     General: There is no distension.     Palpations: Abdomen is soft.     Tenderness: There is no abdominal tenderness.  Musculoskeletal:        General: Tenderness present.     Lumbar back: She exhibits tenderness and pain. She exhibits normal range of motion, no swelling, no deformity, no laceration and normal pulse.     Right lower leg: No edema.     Left lower leg: No edema.     Comments: ttp of the right lumbar paraspinal muscles and right SI joint space.  No spinal tenderness.  Pt has 5/5 strength against resistance of bilateral lower extremities.  Hip flexors and extensors are intact   Skin:    General: Skin is warm.     Capillary Refill: Capillary refill takes less than 2 seconds.     Findings: No rash.  Neurological:     General: No focal deficit present.     Mental Status: She is alert.     Sensory: Sensation is intact. No sensory deficit.     Motor: Motor function is intact. No weakness or abnormal muscle tone.     Gait: Gait normal.     Deep Tendon Reflexes:     Reflex Scores:      Patellar reflexes are 2+ on the right side and 2+ on the left  side.      Achilles reflexes are 2+ on the right side and 2+ on the left side.    Comments: CN II-XII intact.        ED Treatments / Results  Labs (all labs ordered are listed, but only abnormal results are displayed) Labs Reviewed - No data to display  EKG None  Radiology No results found.  Procedures Procedures (including critical care time)  Medications  Ordered in ED Medications  HYDROmorphone (DILAUDID) injection 1 mg (has no administration in time range)  cyclobenzaprine (FLEXERIL) tablet 10 mg (has no administration in time range)     Initial Impression / Assessment and Plan / ED Course  I have reviewed the triage vital signs and the nursing notes.  Pertinent labs & imaging results that were available during my care of the patient were reviewed by me and considered in my medical decision making (see chart for details).       Patient with history of recurrent sciatica.  She is neurovascularly intact.  Blood pressure today is elevated, patient states she did take her medications today and that this happens to her frequently when she is in pain.  Ambulates with a slow but steady gait.  No motor or sensory deficits noted.  No concerning symptoms for emergent neurological process.  Patient is feeling better after medications here, will prescribe pain medication, muscle relaxer, and a short course of steroids.  Discussed importance of close primary care follow-up and need for further evaluation if symptoms are not improving.  No history of recent trauma to indicate need for imaging.  She verbalizes understanding and agrees to plan.    Final Clinical Impressions(s) / ED Diagnoses   Final diagnoses:  Sciatica of right side    ED Discharge Orders    None       Kem Parkinson, PA-C 07/30/18 1417    Milton Ferguson, MD 07/31/18 [phone]

## 2018-07-30 NOTE — Discharge Instructions (Addendum)
Alternate ice and heat to your lower back.  Take the medication as directed.  Follow-up with your primary doctor for recheck if not improving.  Be sure to check your blood sugars daily while taking the prednisone and stop if your blood sugar becomes too elevated.

## 2021-03-06 ENCOUNTER — Emergency Department (HOSPITAL_COMMUNITY): Payer: Medicare Other

## 2021-03-06 ENCOUNTER — Encounter (HOSPITAL_COMMUNITY): Payer: Self-pay

## 2021-03-06 ENCOUNTER — Other Ambulatory Visit: Payer: Self-pay

## 2021-03-06 ENCOUNTER — Inpatient Hospital Stay (HOSPITAL_COMMUNITY)
Admission: EM | Admit: 2021-03-06 | Discharge: 2021-03-09 | DRG: 389 | Disposition: A | Discharge status: Home / Self Care | Payer: Medicare Other | Attending: Internal Medicine | Admitting: Internal Medicine

## 2021-03-06 DIAGNOSIS — K5651 Intestinal adhesions [bands], with partial obstruction: Principal | ICD-10-CM | POA: Diagnosis present

## 2021-03-06 DIAGNOSIS — K56609 Unspecified intestinal obstruction, unspecified as to partial versus complete obstruction: Secondary | ICD-10-CM | POA: Diagnosis not present

## 2021-03-06 DIAGNOSIS — Z79899 Other long term (current) drug therapy: Secondary | ICD-10-CM

## 2021-03-06 DIAGNOSIS — Z23 Encounter for immunization: Secondary | ICD-10-CM

## 2021-03-06 DIAGNOSIS — Z85038 Personal history of other malignant neoplasm of large intestine: Secondary | ICD-10-CM

## 2021-03-06 DIAGNOSIS — R152 Fecal urgency: Secondary | ICD-10-CM | POA: Diagnosis present

## 2021-03-06 DIAGNOSIS — C189 Malignant neoplasm of colon, unspecified: Secondary | ICD-10-CM | POA: Diagnosis present

## 2021-03-06 DIAGNOSIS — Z7984 Long term (current) use of oral hypoglycemic drugs: Secondary | ICD-10-CM

## 2021-03-06 DIAGNOSIS — F909 Attention-deficit hyperactivity disorder, unspecified type: Secondary | ICD-10-CM | POA: Diagnosis present

## 2021-03-06 DIAGNOSIS — I10 Essential (primary) hypertension: Secondary | ICD-10-CM | POA: Diagnosis present

## 2021-03-06 DIAGNOSIS — K56600 Partial intestinal obstruction, unspecified as to cause: Secondary | ICD-10-CM | POA: Diagnosis present

## 2021-03-06 DIAGNOSIS — E119 Type 2 diabetes mellitus without complications: Secondary | ICD-10-CM

## 2021-03-06 DIAGNOSIS — Z791 Long term (current) use of non-steroidal anti-inflammatories (NSAID): Secondary | ICD-10-CM

## 2021-03-06 DIAGNOSIS — Z20822 Contact with and (suspected) exposure to covid-19: Secondary | ICD-10-CM | POA: Diagnosis present

## 2021-03-06 DIAGNOSIS — C787 Secondary malignant neoplasm of liver and intrahepatic bile duct: Secondary | ICD-10-CM | POA: Diagnosis present

## 2021-03-06 DIAGNOSIS — Z9049 Acquired absence of other specified parts of digestive tract: Secondary | ICD-10-CM

## 2021-03-06 DIAGNOSIS — F32A Depression, unspecified: Secondary | ICD-10-CM | POA: Diagnosis present

## 2021-03-06 DIAGNOSIS — E1165 Type 2 diabetes mellitus with hyperglycemia: Secondary | ICD-10-CM | POA: Diagnosis present

## 2021-03-06 DIAGNOSIS — Z8249 Family history of ischemic heart disease and other diseases of the circulatory system: Secondary | ICD-10-CM

## 2021-03-06 DIAGNOSIS — E7800 Pure hypercholesterolemia, unspecified: Secondary | ICD-10-CM | POA: Diagnosis present

## 2021-03-06 DIAGNOSIS — Z888 Allergy status to other drugs, medicaments and biological substances status: Secondary | ICD-10-CM

## 2021-03-06 HISTORY — DX: Sciatica, unspecified side: M54.30

## 2021-03-06 LAB — COMPREHENSIVE METABOLIC PANEL
ALT: 17 U/L (ref 0–44)
AST: 21 U/L (ref 15–41)
Albumin: 4.2 g/dL (ref 3.5–5.0)
Alkaline Phosphatase: 77 U/L (ref 38–126)
Anion gap: 9 (ref 5–15)
BUN: 22 mg/dL (ref 8–23)
CO2: 23 mmol/L (ref 22–32)
Calcium: 9.1 mg/dL (ref 8.9–10.3)
Chloride: 102 mmol/L (ref 98–111)
Creatinine, Ser: 1.01 mg/dL — ABNORMAL HIGH (ref 0.44–1.00)
GFR, Estimated: >60 mL/min (ref >60)
Glucose, Bld: 230 mg/dL — ABNORMAL HIGH (ref 70–99)
Potassium: 4.1 mmol/L (ref 3.5–5.1)
Sodium: 134 mmol/L — ABNORMAL LOW (ref 135–145)
Total Bilirubin: 1.2 mg/dL (ref 0.3–1.2)
Total Protein: 7.5 g/dL (ref 6.5–8.1)

## 2021-03-06 LAB — RESP PANEL BY RT-PCR (FLU A&B, COVID) ARPGX2
Influenza A by PCR: NEGATIVE
Influenza B by PCR: NEGATIVE
SARS Coronavirus 2 by RT PCR: NEGATIVE

## 2021-03-06 LAB — CBC
HCT: 46.4 % — ABNORMAL HIGH (ref 36.0–46.0)
Hemoglobin: 16 g/dL — ABNORMAL HIGH (ref 12.0–15.0)
MCH: 31.9 pg (ref 26.0–34.0)
MCHC: 34.5 g/dL (ref 30.0–36.0)
MCV: 92.6 fL (ref 80.0–100.0)
Platelets: 253 10*3/uL (ref 150–400)
RBC: 5.01 MIL/uL (ref 3.87–5.11)
RDW: 13.1 % (ref 11.5–15.5)
WBC: 18.2 10*3/uL — ABNORMAL HIGH (ref 4.0–10.5)
nRBC: 0 % (ref 0.0–0.2)

## 2021-03-06 LAB — HEMOGLOBIN A1C
Hgb A1c MFr Bld: 7.7 % — ABNORMAL HIGH (ref 4.8–5.6)
Mean Plasma Glucose: 174.29 mg/dL

## 2021-03-06 LAB — GLUCOSE, CAPILLARY: Glucose-Capillary: 143 mg/dL — ABNORMAL HIGH (ref 70–99)

## 2021-03-06 LAB — LIPASE, BLOOD: Lipase: 29 U/L (ref 11–51)

## 2021-03-06 MED ORDER — INFLUENZA VAC SPLIT QUAD 0.5 ML IM SUSY
0.5000 mL | PREFILLED_SYRINGE | INTRAMUSCULAR | Status: AC
Start: 2021-03-07 — End: 2021-03-08
  Administered 2021-03-08: 0.5 mL via INTRAMUSCULAR
  Filled 2021-03-06: qty 0.5

## 2021-03-06 MED ORDER — SODIUM CHLORIDE 0.9 % IV SOLN: INTRAVENOUS | Status: AC

## 2021-03-06 MED ORDER — HYDROMORPHONE HCL 1 MG/ML IJ SOLN
1.0000 mg | Freq: Once | INTRAMUSCULAR | Status: AC
  Administered 2021-03-06: 1 mg via INTRAVENOUS
  Filled 2021-03-06: qty 1

## 2021-03-06 MED ORDER — POLYETHYLENE GLYCOL 3350 17 G PO PACK: 17.0000 g | PACK | Freq: Every day | ORAL | Status: DC | PRN

## 2021-03-06 MED ORDER — HYDROMORPHONE HCL 1 MG/ML IJ SOLN
1.0000 mg | INTRAMUSCULAR | Status: DC | PRN
  Administered 2021-03-06 – 2021-03-07 (×5): 1 mg via INTRAVENOUS
  Filled 2021-03-06 (×5): qty 1

## 2021-03-06 MED ORDER — ONDANSETRON HCL 4 MG/2ML IJ SOLN
4.0000 mg | Freq: Four times a day (QID) | INTRAMUSCULAR | Status: DC | PRN
  Administered 2021-03-07 (×2): 4 mg via INTRAVENOUS
  Filled 2021-03-06 (×2): qty 2

## 2021-03-06 MED ORDER — INSULIN ASPART 100 UNIT/ML IJ SOLN
0.0000 [IU] | INTRAMUSCULAR | Status: DC
  Administered 2021-03-06: 1 [IU] via SUBCUTANEOUS
  Administered 2021-03-07: 2 [IU] via SUBCUTANEOUS
  Administered 2021-03-07 – 2021-03-08 (×6): 1 [IU] via SUBCUTANEOUS
  Administered 2021-03-08: 3 [IU] via SUBCUTANEOUS
  Administered 2021-03-08: 2 [IU] via SUBCUTANEOUS
  Administered 2021-03-08: 1 [IU] via SUBCUTANEOUS
  Administered 2021-03-08 – 2021-03-09 (×2): 2 [IU] via SUBCUTANEOUS
  Administered 2021-03-09: 1 [IU] via SUBCUTANEOUS
  Administered 2021-03-09: 2 [IU] via SUBCUTANEOUS

## 2021-03-06 MED ORDER — SODIUM CHLORIDE 0.9 % IV BOLUS
2000.0000 mL | Freq: Once | INTRAVENOUS | Status: AC
  Administered 2021-03-06: 2000 mL via INTRAVENOUS

## 2021-03-06 MED ORDER — HEPARIN SODIUM (PORCINE) 5000 UNIT/ML IJ SOLN
5000.0000 [IU] | Freq: Three times a day (TID) | INTRAMUSCULAR | Status: DC
  Administered 2021-03-06 – 2021-03-09 (×8): 5000 [IU] via SUBCUTANEOUS
  Filled 2021-03-06 (×8): qty 1

## 2021-03-06 MED ORDER — ONDANSETRON HCL 4 MG PO TABS: 4.0000 mg | ORAL_TABLET | Freq: Four times a day (QID) | ORAL | Status: DC | PRN

## 2021-03-06 MED ORDER — ONDANSETRON HCL 4 MG/2ML IJ SOLN
4.0000 mg | Freq: Once | INTRAMUSCULAR | Status: AC
  Administered 2021-03-06: 4 mg via INTRAVENOUS
  Filled 2021-03-06: qty 2

## 2021-03-06 MED ORDER — ACETAMINOPHEN 650 MG RE SUPP: 650.0000 mg | Freq: Four times a day (QID) | RECTAL | Status: DC | PRN

## 2021-03-06 MED ORDER — ACETAMINOPHEN 325 MG PO TABS
650.0000 mg | ORAL_TABLET | Freq: Four times a day (QID) | ORAL | Status: DC | PRN
  Administered 2021-03-08 (×3): 650 mg via ORAL
  Filled 2021-03-06 (×3): qty 2

## 2021-03-06 NOTE — Progress Notes (Signed)
**Note De-identified Maria Luna Obfuscation** EKG complete and placed in patient chart 

## 2021-03-06 NOTE — ED Provider Notes (Signed)
[facility] Provider Note   CSN: [id number] Arrival date & time: 03/06/21  1137     History  Chief Complaint  Patient presents with   Abdominal Pain    Maria Luna is a 61 y.o. female.  Patient with history of hypertension diabetes and resected colon cancer 30 years ago.  She started vomiting at 7 AM and has not stopped.  She said she has abdominal pain and has not been passing gas  The history is provided by the patient and medical records. No language interpreter was used.  Abdominal Pain Pain location:  Generalized Pain quality: aching   Pain radiates to:  Does not radiate Pain severity:  Moderate Onset quality:  Sudden Timing:  Constant Progression:  Worsening Chronicity:  New Context: not alcohol use   Relieved by:  Nothing Associated symptoms: vomiting   Associated symptoms: no chest pain, no cough, no diarrhea, no fatigue and no hematuria       Home Medications Prior to Admission medications   Medication Sig Start Date End Date Taking? Authorizing Provider  amphetamine-dextroamphetamine (ADDERALL) 10 MG tablet Take 10 mg by mouth 4 (four) times daily. 03/24/13   [provider]  atovaquone-proguanil (MALARONE) 250-100 MG TABS tablet Take 1 tablet by mouth daily. Start taking on sept 10th on full stomach until complete 09/18/17   Carlyle Basques, MD  azithromycin (ZITHROMAX) 500 MG tablet Take 1 tablet (500 mg total) by mouth daily. If you have 3+ watery diarrhea/24hr. Can stop taking if diarrhea resolves 09/18/17   Carlyle Basques, MD  citalopram (CELEXA) 20 MG tablet Take 20 mg by mouth daily. 03/24/13   [provider]  cyclobenzaprine (FLEXERIL) 10 MG tablet Take 1 tablet (10 mg total) by mouth 3 (three) times daily as needed. May cause drowsiness 07/30/18   Triplett, Tammy, PA-C  HYDROcodone-acetaminophen (NORCO) 7.5-325 MG tablet Take 1 tablet by mouth every 6 (six) hours as needed for moderate pain. 07/30/18   Triplett,  Tammy, PA-C  lisinopril (PRINIVIL,ZESTRIL) 10 MG tablet Take 10 mg by mouth daily. 03/23/13   [provider]  meloxicam (MOBIC) 7.5 MG tablet 1 po bid with food 04/14/13   Lily Kocher, PA-C  metFORMIN (GLUCOPHAGE-XR) 500 MG 24 hr tablet Take 1,000 mg by mouth 2 (two) times daily. 03/24/13   [provider]  predniSONE (DELTASONE) 10 MG tablet Take 6 tablets day one, 5 tablets day two, 4 tablets day three, 3 tablets day four, 2 tablets day five, then 1 tablet day six 07/30/18   Triplett, Tammy, PA-C      Allergies    Iohexol    Review of Systems   Review of Systems  Constitutional:  Negative for appetite change and fatigue.  HENT:  Negative for congestion, ear discharge and sinus pressure.   Eyes:  Negative for discharge.  Respiratory:  Negative for cough.   Cardiovascular:  Negative for chest pain.  Gastrointestinal:  Positive for abdominal pain and vomiting. Negative for diarrhea.  Genitourinary:  Negative for frequency and hematuria.  Musculoskeletal:  Negative for back pain.  Skin:  Negative for rash.  Neurological:  Negative for seizures and headaches.  Psychiatric/Behavioral:  Negative for hallucinations.    Physical Exam Updated Vital Signs BP (!) 147/69    Pulse 85    Temp 98.1 F (36.7 C) (Oral)    Resp 16    Ht 5\' 2"  (1.575 m)    Wt 90.7 kg    SpO2 93%  BMI 36.58 kg/m  Physical Exam Vitals and nursing note reviewed.  Constitutional:      Appearance: She is well-developed.  HENT:     Head: Normocephalic.     Nose: Nose normal.  Eyes:     General: No scleral icterus.    Conjunctiva/sclera: Conjunctivae normal.  Neck:     Thyroid: No thyromegaly.  Cardiovascular:     Rate and Rhythm: Normal rate and regular rhythm.     Heart sounds: No murmur heard.   No friction rub. No gallop.  Pulmonary:     Breath sounds: No stridor. No wheezing or rales.  Chest:     Chest wall: No tenderness.  Abdominal:     General: There is no distension.      Tenderness: There is abdominal tenderness. There is no rebound.  Musculoskeletal:        General: Normal range of motion.     Cervical back: Neck supple.  Lymphadenopathy:     Cervical: No cervical adenopathy.  Skin:    Findings: No erythema or rash.  Neurological:     Mental Status: She is alert and oriented to person, place, and time.     Motor: No abnormal muscle tone.     Coordination: Coordination normal.  Psychiatric:        Behavior: Behavior normal.    ED Results / Procedures / Treatments   Labs (all labs ordered are listed, but only abnormal results are displayed) Labs Reviewed  COMPREHENSIVE METABOLIC PANEL - Abnormal; Notable for the following components:      Result Value   Sodium 134 (*)    Glucose, Bld 230 (*)    Creatinine, Ser 1.01 (*)    All other components within normal limits  CBC - Abnormal; Notable for the following components:   WBC 18.2 (*)    Hemoglobin 16.0 (*)    HCT 46.4 (*)    All other components within normal limits  LIPASE, BLOOD  URINALYSIS, ROUTINE W REFLEX MICROSCOPIC    EKG None  Radiology CT ABDOMEN PELVIS WO CONTRAST  Result Date: 03/06/2021 CLINICAL DATA:  Abdominal pain, acute, nonlocalized; 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 RADIATION DOSE REDUCTION: This exam was performed according to the departmental dose-optimization program which includes automated exposure control, adjustment of the mA and/or kV according to patient size and/or use of iterative reconstruction technique. COMPARISON:  Most recent prior study is from 2009 FINDINGS: Lower chest: No acute abnormality. Hepatobiliary: Partial right lobe resection. No focal liver abnormality. Cholecystectomy. No unexpected biliary dilatation. Pancreas: Unremarkable. Spleen: Unremarkable. Adrenals/Urinary  Tract: Adrenals are unremarkable. Kidneys and bladder are unremarkable. Stomach/Bowel: Stomach is within normal limits. There are mildly dilated loops of fluid-filled small bowel. A fecalized loop is present in the right abdomen (series 5, image 31) and may reflect transition point. More proximal and distal small bowel loops are relatively decompressed. Colon is normal in caliber. There is a colonic anastomosis at the level of the sigmoid. Normal appendix. Vascular/Lymphatic: Minor atherosclerosis.  No enlarged nodes. Reproductive: Uterus and bilateral adnexa are unremarkable. Other: Trace perisplenic ascites. Abdominal wall is unremarkable. Retained intraperitoneal in catheter or drain. Musculoskeletal: Degenerative changes of the lumbar spine. IMPRESSION: Partial small bowel obstruction with mildly dilated loops. Transition point may be at a fecalized loop in the right abdomen. More proximal in distal small bowel  loops are relatively decompressed. Electronically Signed   By: Macy Mis M.D.   On: 03/06/2021 14:05    Procedures Procedures    Medications Ordered in ED Medications  sodium chloride 0.9 % bolus 2,000 mL (2,000 mLs Intravenous New Bag/Given 03/06/21 1318)  HYDROmorphone (DILAUDID) injection 1 mg (1 mg Intravenous Given 03/06/21 1319)  ondansetron (ZOFRAN) injection 4 mg (4 mg Intravenous Given 03/06/21 1317)    ED Course/ Medical Decision Making/ A&P                           Medical Decision Making  Patient with partial small bowel obstruction.  She will be admitted to medicine with surgery consult   This patient presents to the ED for concern of vomiting, this involves an extensive number of treatment options, and is a complaint that carries with it a high risk of complications and morbidity.  The differential diagnosis includes bowel obstruction, appendicitis   Co morbidities that complicate the patient evaluation  Colon cancer with resection   Additional history  obtained:  Additional history obtained from patient External records from outside source obtained and reviewed including old record   Lab Tests:  I Ordered, and personally interpreted labs.  The pertinent results include: White count elevated at 18   Imaging Studies ordered:  I ordered imaging studies including CT abdomen I independently visualized and interpreted imaging which showed small bowel obstruction I agree with the radiologist interpretation   Cardiac Monitoring:  The patient was maintained on a cardiac monitor.  I personally viewed and interpreted the cardiac monitored which showed an underlying rhythm of: Normal sinus rhythm   Medicines ordered and prescription drug management:  I ordered medication including fluids for dehydration Zofran for nausea Reevaluation of the patient after these medicines showed that the patient improved I have reviewed the patients home medicines and have made adjustments as needed   Test Considered:  MRI   Critical Interventions:  None   Consultations Obtained:  I requested consultation with the general surgery,  and discussed lab and imaging findings as well as pertinent plan - they recommend: Admission IV hydration possible NG tube   Problem List / ED Course:  Mall bowel obstruction   Reevaluation:  After the interventions noted above, I reevaluated the patient and found that they have :stayed the same   Social Determinants of Health:  None   Dispostion:  After consideration of the diagnostic results and the patients response to treatment, I feel that the patent would benefit from admission.         Final Clinical Impression(s) / ED Diagnoses Final diagnoses:  None    Rx / DC Orders ED Discharge Orders     None         Milton Ferguson, MD 03/09/21 1103

## 2021-03-06 NOTE — H&P (Signed)
History and Physical    MARGAURITE Luna PJK:[id number] DOB: Sep 30, 1960 DOA: 03/06/2021  PCP: Chesley Noon, MD   Patient coming from: Home  I have personally briefly reviewed patient's old medical records in [place]  Chief Complaint: Abdominal pain  HPI: Maria Luna is a 61 y.o. female with medical history significant for colon cancer, diabetes mellitus, hypertension. Presented to the ED with complaints of sudden onset of diffuse abdominal pain that started this morning at about 7 AM.  She reports at least 5 episodes of vomiting since then.  She reports since her colostomy which has been reversed, she normally has watery stools at baseline.  Her last bowel movement was yesterday, she had 4 bowel movements yesterday.  None today.  Abdomen feels bloated.  No fevers no chills.  No dysuria.  ED Course: Stable vitals.  WBC 18.2.  CT abdomen pelvis without contrast shows partial small bowel obstruction.  EDP talked to general surgery, will see in the morning, can hold off on NG tube for now, unless if vomiting.  Review of Systems: As per HPI all other systems reviewed and negative.  Past Medical History:  Diagnosis Date   Cancer ([place])    Diabetes mellitus without complication ([place])    High cholesterol    Hypertension    Sciatica     Past Surgical History:  Procedure Laterality Date   ABDOMINAL HYSTERECTOMY     COLON SURGERY     COLOSTOMY     LIVER SURGERY       reports that she has never smoked. She has never used smokeless tobacco. She reports current alcohol use. She reports that she does not use drugs.  Allergies  Allergen Reactions   Iohexol      Desc: THROAT ITCHING AND TINGLING, NEEDS PRE-MEDS.     Family history of hypertension  Prior to Admission medications   Medication Sig Start Date End Date Taking? Authorizing Provider  amphetamine-dextroamphetamine (ADDERALL) 10 MG tablet Take 10 mg by mouth 4 (four) times daily. 03/24/13   [provider]  atovaquone-proguanil (MALARONE) 250-100 MG TABS tablet Take 1 tablet by mouth daily. Start taking on sept 10th on full stomach until complete 09/18/17   Carlyle Basques, MD  azithromycin (ZITHROMAX) 500 MG tablet Take 1 tablet (500 mg total) by mouth daily. If you have 3+ watery diarrhea/24hr. Can stop taking if diarrhea resolves 09/18/17   Carlyle Basques, MD  citalopram (CELEXA) 20 MG tablet Take 20 mg by mouth daily. 03/24/13   [provider]  cyclobenzaprine (FLEXERIL) 10 MG tablet Take 1 tablet (10 mg total) by mouth 3 (three) times daily as needed. May cause drowsiness 07/30/18   Triplett, Tammy, PA-C  HYDROcodone-acetaminophen (NORCO) 7.5-325 MG tablet Take 1 tablet by mouth every 6 (six) hours as needed for moderate pain. 07/30/18   Triplett, Tammy, PA-C  lisinopril (PRINIVIL,ZESTRIL) 10 MG tablet Take 10 mg by mouth daily. 03/23/13   [provider]  meloxicam (MOBIC) 7.5 MG tablet 1 po bid with food 04/14/13   Lily Kocher, PA-C  metFORMIN (GLUCOPHAGE-XR) 500 MG 24 hr tablet Take 1,000 mg by mouth 2 (two) times daily. 03/24/13   [provider]  predniSONE (DELTASONE) 10 MG tablet Take 6 tablets day one, 5 tablets day two, 4 tablets day three, 3 tablets day four, 2 tablets day five, then 1 tablet day six 07/30/18   Kem Parkinson, PA-C    Physical Exam: Vitals:   03/06/21 1155 03/06/21 1330  03/06/21 1400 03/06/21 1500  BP: (!) 176/98 122/70 136/75 (!) 147/69  Pulse: (!) 107 85 86 85  Resp: 16  16 16   Temp: 98.1 F (36.7 C)     TempSrc: Oral     SpO2: 95% 92% 93% 93%  Weight:      Height:        Constitutional: NAD, calm, comfortable Vitals:   03/06/21 1155 03/06/21 1330 03/06/21 1400 03/06/21 1500  BP: (!) 176/98 122/70 136/75 (!) 147/69  Pulse: (!) 107 85 86 85  Resp: 16  16 16   Temp: 98.1 F (36.7 C)     TempSrc: Oral     SpO2: 95% 92% 93% 93%  Weight:      Height:       Eyes: PERRL, lids and conjunctivae normal ENMT: Mucous  membranes are dry Neck: normal, supple, no masses, no thyromegaly Respiratory: clear to auscultation bilaterally, no wheezing, no crackles. Normal respiratory effort. No accessory muscle use.  Cardiovascular: Regular rate and rhythm, no murmurs / rubs / gallops.  Lower extremities warm and well-perfused Abdomen: Mild epigastric tenderness, abdomen full, soft, does not appear distended, no masses palpated. No hepatosplenomegaly.   Musculoskeletal: no clubbing / cyanosis. No joint deformity upper and lower extremities. Good ROM, no contractures. Normal muscle tone.  Skin: no rashes, lesions, ulcers. No induration Neurologic: No apparent cranial problems, moving extremities spontaneously Psychiatric: Normal judgment and insight. Alert and oriented x 3. Normal mood.   Labs on Admission: I have personally reviewed following labs and imaging studies  CBC: Recent Labs  Lab 03/06/21 1215  WBC 18.2*  HGB 16.0*  HCT 46.4*  MCV 92.6  PLT 630   Basic Metabolic Panel: Recent Labs  Lab 03/06/21 1215  NA 134*  K 4.1  CL 102  CO2 23  GLUCOSE 230*  BUN 22  CREATININE 1.01*  CALCIUM 9.1   GFR: Estimated Creatinine Clearance: 61.2 mL/min (A) (by C-G formula based on SCr of 1.01 mg/dL (H)). Liver Function Tests: Recent Labs  Lab 03/06/21 1215  AST 21  ALT 17  ALKPHOS 77  BILITOT 1.2  PROT 7.5  ALBUMIN 4.2   Recent Labs  Lab 03/06/21 1215  LIPASE 29    Radiological Exams on Admission: CT ABDOMEN PELVIS WO CONTRAST  Result Date: 03/06/2021 CLINICAL DATA:  Abdominal pain, acute, nonlocalized; 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 RADIATION DOSE REDUCTION: This exam was performed according to the departmental dose-optimization program which includes automated exposure control, adjustment of the mA and/or kV  according to patient size and/or use of iterative reconstruction technique. COMPARISON:  Most recent prior study is from 2009 FINDINGS: Lower chest: No acute abnormality. Hepatobiliary: Partial right lobe resection. No focal liver abnormality. Cholecystectomy. No unexpected biliary dilatation. Pancreas: Unremarkable. Spleen: Unremarkable. Adrenals/Urinary Tract: Adrenals are unremarkable. Kidneys and bladder are unremarkable. Stomach/Bowel: Stomach is within normal limits. There are mildly dilated loops of fluid-filled small bowel. A fecalized loop is present in the right abdomen (series 5, image 31) and may reflect transition point. More proximal and distal small bowel loops are relatively decompressed. Colon is normal in caliber. There is a colonic anastomosis at the level of the sigmoid. Normal appendix. Vascular/Lymphatic: Minor atherosclerosis.  No enlarged nodes. Reproductive: Uterus and bilateral adnexa are unremarkable. Other: Trace perisplenic ascites. Abdominal wall is unremarkable. Retained intraperitoneal in catheter or  drain. Musculoskeletal: Degenerative changes of the lumbar spine. IMPRESSION: Partial small bowel obstruction with mildly dilated loops. Transition point may be at a fecalized loop in the right abdomen. More proximal in distal small bowel loops are relatively decompressed. Electronically Signed   By: Macy Mis M.D.   On: 03/06/2021 14:05    EKG: None.   Assessment/Plan Principal Problem:   Partial small bowel obstruction ([facility]) Active Problems:   DM (diabetes mellitus) ([place])   HTN (hypertension)   ADHD   Colon cancer (H[facility])   Partial small bowel obstruction-CT abdomen and pelvis without contrast- Partial small bowel obstruction with mildly dilated loops. Transition point may be at a fecalized loop in the right abdomen.  History of abdominal surgeries-colectomy, colostomy with reversal, and hysterectomy - At baseline has diarrhea 2/2 colectomy, bowel movement was  yesterday, she had 4. - N/s 100cc/hr  x 1 day - IV Dilaudid 1 mg q4h prn - Gen surg, Dr. Constance Haw to see in a.m  -Hold NG tube for now, if recurrent vomiting or worsening pain, patient agrees to have it placed - Obtain EKG check Qtc  Colon cancer history-history of colectomy, colostomy and then reversal.  HTN-stable. -Hold lisinopril while NPO  Uncontrolled DM- blood sugar 230. HgbA1c- 8.  -Hold metformin, gabapentin - SSi- S Q4h  ADHD, depression -Hold Adderall,  DVT prophylaxis: heparin Code Status: Full code Family Communication: Spouse at bedside Disposition Plan: ~ 2 days Consults called: Gen surg Admission status: Obs Medsurg  Bethena Roys MD [facility]  03/06/2021, 4:59 PM

## 2021-03-06 NOTE — ED Triage Notes (Signed)
Pt presents to the ED with complaints of sharp generalized abdominal pain and vomiting since 0700.

## 2021-03-07 ENCOUNTER — Encounter (HOSPITAL_COMMUNITY): Payer: Self-pay | Admitting: Internal Medicine

## 2021-03-07 ENCOUNTER — Observation Stay (HOSPITAL_COMMUNITY): Payer: Medicare Other

## 2021-03-07 DIAGNOSIS — E7800 Pure hypercholesterolemia, unspecified: Secondary | ICD-10-CM | POA: Diagnosis present

## 2021-03-07 DIAGNOSIS — Z9049 Acquired absence of other specified parts of digestive tract: Secondary | ICD-10-CM | POA: Diagnosis not present

## 2021-03-07 DIAGNOSIS — Z20822 Contact with and (suspected) exposure to covid-19: Secondary | ICD-10-CM | POA: Diagnosis present

## 2021-03-07 DIAGNOSIS — Z8249 Family history of ischemic heart disease and other diseases of the circulatory system: Secondary | ICD-10-CM | POA: Diagnosis not present

## 2021-03-07 DIAGNOSIS — Z79899 Other long term (current) drug therapy: Secondary | ICD-10-CM | POA: Diagnosis not present

## 2021-03-07 DIAGNOSIS — C787 Secondary malignant neoplasm of liver and intrahepatic bile duct: Secondary | ICD-10-CM | POA: Diagnosis present

## 2021-03-07 DIAGNOSIS — Z23 Encounter for immunization: Secondary | ICD-10-CM | POA: Diagnosis present

## 2021-03-07 DIAGNOSIS — Z7984 Long term (current) use of oral hypoglycemic drugs: Secondary | ICD-10-CM | POA: Diagnosis not present

## 2021-03-07 DIAGNOSIS — F32A Depression, unspecified: Secondary | ICD-10-CM | POA: Diagnosis present

## 2021-03-07 DIAGNOSIS — K5651 Intestinal adhesions [bands], with partial obstruction: Secondary | ICD-10-CM | POA: Diagnosis present

## 2021-03-07 DIAGNOSIS — R152 Fecal urgency: Secondary | ICD-10-CM | POA: Diagnosis present

## 2021-03-07 DIAGNOSIS — F909 Attention-deficit hyperactivity disorder, unspecified type: Secondary | ICD-10-CM | POA: Diagnosis present

## 2021-03-07 DIAGNOSIS — I10 Essential (primary) hypertension: Secondary | ICD-10-CM | POA: Diagnosis present

## 2021-03-07 DIAGNOSIS — Z888 Allergy status to other drugs, medicaments and biological substances status: Secondary | ICD-10-CM | POA: Diagnosis not present

## 2021-03-07 DIAGNOSIS — E1165 Type 2 diabetes mellitus with hyperglycemia: Secondary | ICD-10-CM | POA: Diagnosis present

## 2021-03-07 DIAGNOSIS — C189 Malignant neoplasm of colon, unspecified: Secondary | ICD-10-CM

## 2021-03-07 DIAGNOSIS — Z85038 Personal history of other malignant neoplasm of large intestine: Secondary | ICD-10-CM | POA: Diagnosis not present

## 2021-03-07 DIAGNOSIS — K56609 Unspecified intestinal obstruction, unspecified as to partial versus complete obstruction: Secondary | ICD-10-CM | POA: Diagnosis present

## 2021-03-07 DIAGNOSIS — Z791 Long term (current) use of non-steroidal anti-inflammatories (NSAID): Secondary | ICD-10-CM | POA: Diagnosis not present

## 2021-03-07 LAB — BASIC METABOLIC PANEL
Anion gap: 7 (ref 5–15)
BUN: 21 mg/dL (ref 8–23)
CO2: 23 mmol/L (ref 22–32)
Calcium: 8.3 mg/dL — ABNORMAL LOW (ref 8.9–10.3)
Chloride: 108 mmol/L (ref 98–111)
Creatinine, Ser: 0.79 mg/dL (ref 0.44–1.00)
GFR, Estimated: >60 mL/min (ref >60)
Glucose, Bld: 136 mg/dL — ABNORMAL HIGH (ref 70–99)
Potassium: 4.1 mmol/L (ref 3.5–5.1)
Sodium: 138 mmol/L (ref 135–145)

## 2021-03-07 LAB — CBC
HCT: 39.1 % (ref 36.0–46.0)
Hemoglobin: 12.8 g/dL (ref 12.0–15.0)
MCH: 30.2 pg (ref 26.0–34.0)
MCHC: 32.7 g/dL (ref 30.0–36.0)
MCV: 92.2 fL (ref 80.0–100.0)
Platelets: 233 10*3/uL (ref 150–400)
RBC: 4.24 MIL/uL (ref 3.87–5.11)
RDW: 13.2 % (ref 11.5–15.5)
WBC: 10.8 10*3/uL — ABNORMAL HIGH (ref 4.0–10.5)
nRBC: 0 % (ref 0.0–0.2)

## 2021-03-07 LAB — GLUCOSE, CAPILLARY
Glucose-Capillary: 122 mg/dL — ABNORMAL HIGH (ref 70–99)
Glucose-Capillary: 132 mg/dL — ABNORMAL HIGH (ref 70–99)
Glucose-Capillary: 132 mg/dL — ABNORMAL HIGH (ref 70–99)
Glucose-Capillary: 137 mg/dL — ABNORMAL HIGH (ref 70–99)
Glucose-Capillary: 141 mg/dL — ABNORMAL HIGH (ref 70–99)
Glucose-Capillary: 157 mg/dL — ABNORMAL HIGH (ref 70–99)

## 2021-03-07 LAB — HIV ANTIBODY (ROUTINE TESTING W REFLEX): HIV Screen 4th Generation wRfx: NONREACTIVE

## 2021-03-07 MED ORDER — DIATRIZOATE MEGLUMINE & SODIUM 66-10 % PO SOLN
90.0000 mL | Freq: Once | ORAL | Status: DC
  Filled 2021-03-07: qty 90

## 2021-03-07 NOTE — Progress Notes (Signed)
[facility]  Small bowel follow through with contrast in the colon and no obstruction. Will start clear diet and advance as tolerated. Patient made aware and team made aware.   Curlene Labrum MD

## 2021-03-07 NOTE — Progress Notes (Signed)
°  Transition of Care [facility]) Screening Note   Patient Details  Name: ELFREIDA HEGGS Date of Birth: [birth year]   Transition of Care [facility]) CM/SW Contact:    Boneta Lucks, RN Phone Number: 03/07/2021, 11:18 AM  OBS status  Transition of Care Department [facility]) has reviewed patient and no TOC needs have been identified at this time. We will continue to monitor patient advancement through interdisciplinary progression rounds. If new patient transition needs arise, please place a TOC consult.

## 2021-03-07 NOTE — Progress Notes (Signed)
Patient just report she finally past some gas.

## 2021-03-07 NOTE — Consult Note (Addendum)
I was present with the medical student for this service. I personally verified the history of present illness, performed the physical exam, and made the plan for this encounter. I have verified the medical student's documentation and made modifications where appropriately. I have personally documented in my own words a brief history, physical, and plan below.     Patient with colon cancer at age 61 s/p resection, partial hepatectomy, and retained cathter likely form intrathecal chemotherapy. She has possible pSBO on CT. Will do a small bowel follow through to see if she is truly obstructed. Discussed exam and risk of needing surgery if obstructed. Discussed that we try to avoid surgery given creation of more scar tissue. Discussed SBO could recur.   Updated team. Independently reviewed CT with dilated SB in the RUQ with some fecalization, catheter in place, no apparent transition, radiology agrees  Curlene Labrum, MD [facility] [place], [place] [id number] [phone] (office)   Reason for Consult: Partial Small Bowel Obstruction Referring Physician: Dr. Theodoro Grist is an 61 y.o. female with a history of colon cancer s/p colectomy, DM, and HTN who presented to the ED yesterday with acute abdominal pain and vomiting.    HPI: Patient had 5 episodes of non-blood, non-bilious vomiting that began yesterday at 7 AM. She notes that she typically has 4-5 loose bowel movements daily, but did not have any bowel movements or flatus yesterday. She states she has not passed any gas or had a BM today. She reports that the abdominal pain is still present and sharp in nature, but much improved compared to yesterday due to the pain medications. She has not had any more episodes of vomiting since yesterday afternoon.   She denies prior bowel obstructions. She denies any abdominal hernias. Her most recent colonoscopy was about 10 years ago.   She  denies headache, fever, chills, cough, or chest pain. She endorses urinary urgency and occasional incontinence but denies dysuria. She has no known sick contacts.   Past Medical History:  -Colon cancer with liver mets: originally diagnosed in 64 during pregnancy at age 58. Underwent colectomy in 1991 in [place], then partial resection of R lobe of liver 1994 at [facility]. Received target chemotherapy for liver mets. Had colostomy reversal in 1999 also at [facility].  -Fecal urgency and incontinence since colostomy reversal  -Diabetes mellitus -Hypertension  -Sciatica  -Urinary incontinence 2/2 cystocele   Past Surgical History:  Colectomy - 1991 Partial Hepatectomy - 1994 Colostomy reversal - 1999 States she had a total abdominal hysterectomy in 1991 as well but uterus is visible on CT pelvis  Social History: Worked as a [employer]ar[employer] manager, currently trying to obtain disability due to fecal urgency/incontinence which has been ongoing since colostomy reversal. Has one daughter. She reports that she has never smoked. She has never used smokeless tobacco. She reports current alcohol use. She reports that she does not use drugs.  Allergies: Iohexol - states she developed a fever and itching after receiving IV contrast in the past. She is unsure if she has received IV contrast since.   Medications: I have reviewed the patient's current medications.  Prior to Admission:  Medications Prior to Admission  Medication Sig Dispense Refill Last Dose   amphetamine-dextroamphetamine (ADDERALL) 10 MG tablet Take 10 mg by mouth 4 (four) times daily.   Past Week   FLUoxetine (PROZAC) 20 MG capsule Take 1 capsule by mouth daily.   03/05/2021   gabapentin (NEURONTIN) 100 MG  capsule Take 1 capsule by mouth 3 (three) times daily.   03/05/2021   HYDROcodone-acetaminophen (NORCO) 10-325 MG tablet Take by mouth.   03/05/2021   lisinopril (PRINIVIL,ZESTRIL) 10 MG tablet Take 10 mg by mouth daily.   03/05/2021   meloxicam (MOBIC) 7.5 MG  tablet 1 po bid with food 12 tablet 0 03/05/2021   metFORMIN (GLUCOPHAGE-XR) 500 MG 24 hr tablet Take 1,000 mg by mouth 2 (two) times daily.   03/05/2021   rosuvastatin (CRESTOR) 10 MG tablet Take by mouth.   03/05/2021   atovaquone-proguanil (MALARONE) 250-100 MG TABS tablet Take 1 tablet by mouth daily. Start taking on sept 10th on full stomach until complete (Patient not taking: Reported on 03/06/2021) 27 tablet 0 Not Taking   azithromycin (ZITHROMAX) 500 MG tablet Take 1 tablet (500 mg total) by mouth daily. If you have 3+ watery diarrhea/24hr. Can stop taking if diarrhea resolves (Patient not taking: Reported on 03/06/2021) 5 tablet 0 Completed Course   cyclobenzaprine (FLEXERIL) 10 MG tablet Take 1 tablet (10 mg total) by mouth 3 (three) times daily as needed. May cause drowsiness (Patient not taking: Reported on 03/06/2021) 21 tablet 0 Not Taking   HYDROcodone-acetaminophen (NORCO) 7.5-325 MG tablet Take 1 tablet by mouth every 6 (six) hours as needed for moderate pain. (Patient not taking: Reported on 03/06/2021) 12 tablet 0 Completed Course   predniSONE (DELTASONE) 10 MG tablet Take 6 tablets day one, 5 tablets day two, 4 tablets day three, 3 tablets day four, 2 tablets day five, then 1 tablet day six (Patient not taking: Reported on 03/06/2021) 21 tablet 0 Not Taking   Labs:  Results for orders placed or performed during the hospital encounter of 03/06/21 (from the past 48 hour(s))  Lipase, blood     Status: None   Collection Time: 03/06/21 12:15 PM  Result Value Ref Range   Lipase 29 11 - 51 U/L    Comment: Performed at [facility], [address], [place], [place] [address]  Comprehensive metabolic panel     Status: Abnormal   Collection Time: 03/06/21 12:15 PM  Result Value Ref Range   Sodium 134 (L) 135 - 145 mmol/L   Potassium 4.1 3.5 - 5.1 mmol/L   Chloride 102 98 - 111 mmol/L   CO2 23 22 - 32 mmol/L   Glucose, Bld 230 (H) 70 - 99 mg/dL    Comment: Glucose reference range applies only  to samples taken after fasting for at least 8 hours.   BUN 22 8 - 23 mg/dL   Creatinine, Ser 1.01 (H) 0.44 - 1.00 mg/dL   Calcium 9.1 8.9 - 10.3 mg/dL   Total Protein 7.5 6.5 - 8.1 g/dL   Albumin 4.2 3.5 - 5.0 g/dL   AST 21 15 - 41 U/L   ALT 17 0 - 44 U/L   Alkaline Phosphatase 77 38 - 126 U/L   Total Bilirubin 1.2 0.3 - 1.2 mg/dL   GFR, Estimated >60 >60 mL/min    Comment: (NOTE) Calculated using the CKD-EPI Creatinine Equation (2021)    Anion gap 9 5 - 15    Comment: Performed at [facility], [address], [place], [place] [address]  CBC     Status: Abnormal   Collection Time: 03/06/21 12:15 PM  Result Value Ref Range   WBC 18.2 (H) 4.0 - 10.5 K/uL   RBC 5.01 3.87 - 5.11 MIL/uL   Hemoglobin 16.0 (H) 12.0 - 15.0 g/dL   HCT 46.4 (H) 36.0 -  46.0 %   MCV 92.6 80.0 - 100.0 fL   MCH 31.9 26.0 - 34.0 pg   MCHC 34.5 30.0 - 36.0 g/dL   RDW 13.1 11.5 - 15.5 %   Platelets 253 150 - 400 K/uL   nRBC 0.0 0.0 - 0.2 %    Comment: Performed at [facility], [address] [place], [place] [address]  Hemoglobin A1c     Status: Abnormal   Collection Time: 03/06/21 12:15 PM  Result Value Ref Range   Hgb A1c MFr Bld 7.7 (H) 4.8 - 5.6 %    Comment: (NOTE) Pre diabetes:          5.7%-6.4%  Diabetes:              >6.4%  Glycemic control for   <7.0% adults with diabetes    Mean Plasma Glucose 174.29 mg/dL    Comment: Performed at [facility] [address] [place], [place] [address]  Resp Panel by RT-PCR (Flu A&B, Covid) Nasopharyngeal Swab     Status: None   Collection Time: 03/06/21  4:16 PM   Specimen: Nasopharyngeal Swab; Nasopharyngeal(NP) swabs in vial transport medium  Result Value Ref Range   SARS Coronavirus 2 by RT PCR NEGATIVE NEGATIVE    Comment: (NOTE) SARS-CoV-2 target nucleic acids are NOT DETECTED.  The SARS-CoV-2 RNA is generally detectable in upper respiratory specimens during the acute phase of infection. The lowest concentration of SARS-CoV-2 viral copies  this assay can detect is 138 copies/mL. A negative result does not preclude SARS-Cov-2 infection and should not be used as the sole basis for treatment or other patient management decisions. A negative result may occur with  improper specimen collection/handling, submission of specimen other than nasopharyngeal swab, presence of viral mutation(s) within the areas targeted by this assay, and inadequate number of viral copies(<138 copies/mL). A negative result must be combined with clinical observations, patient history, and epidemiological information. The expected result is Negative.  Fact Sheet for Patients:  EntrepreneurPulse.com.au  Fact Sheet for Healthcare Providers:  IncredibleEmployment.be  This test is no t yet approved or cleared by the [place] FDA and  has been authorized for detection and/or diagnosis of SARS-CoV-2 by FDA under an Emergency Use Authorization (EUA). This EUA will remain  in effect (meaning this test can be used) for the duration of the COVID-19 declaration under Section 564(b)(1) of the Act, 21 U.S.C.section 360bbb-3(b)(1), unless the authorization is terminated  or revoked sooner.       Influenza A by PCR NEGATIVE NEGATIVE   Influenza B by PCR NEGATIVE NEGATIVE    Comment: (NOTE) The Xpert Xpress SARS-CoV-2/FLU/RSV plus assay is intended as an aid in the diagnosis of influenza from Nasopharyngeal swab specimens and should not be used as a sole basis for treatment. Nasal washings and aspirates are unacceptable for Xpert Xpress SARS-CoV-2/FLU/RSV testing.  Fact Sheet for Patients: EntrepreneurPulse.com.au  Fact Sheet for Healthcare Providers: IncredibleEmployment.be  This test is not yet approved or cleared by the [place] FDA and has been authorized for detection and/or diagnosis of SARS-CoV-2 by FDA under an Emergency Use Authorization (EUA). This EUA will  remain in effect (meaning this test can be used) for the duration of the COVID-19 declaration under Section 564(b)(1) of the Act, 21 U.S.C. section 360bbb-3(b)(1), unless the authorization is terminated or revoked.  Performed at [facility], [address] [place], [place] [address]   Glucose, capillary     Status: Abnormal   Collection Time:  03/06/21  8:02 PM  Result Value Ref Range   Glucose-Capillary 143 (H) 70 - 99 mg/dL    Comment: Glucose reference range applies only to samples taken after fasting for at least 8 hours.  Glucose, capillary     Status: Abnormal   Collection Time: 03/07/21 12:26 AM  Result Value Ref Range   Glucose-Capillary 132 (H) 70 - 99 mg/dL    Comment: Glucose reference range applies only to samples taken after fasting for at least 8 hours.  Glucose, capillary     Status: Abnormal   Collection Time: 03/07/21  3:58 AM  Result Value Ref Range   Glucose-Capillary 132 (H) 70 - 99 mg/dL    Comment: Glucose reference range applies only to samples taken after fasting for at least 8 hours.  Basic metabolic panel     Status: Abnormal   Collection Time: 03/07/21  5:02 AM  Result Value Ref Range   Sodium 138 135 - 145 mmol/L   Potassium 4.1 3.5 - 5.1 mmol/L   Chloride 108 98 - 111 mmol/L   CO2 23 22 - 32 mmol/L   Glucose, Bld 136 (H) 70 - 99 mg/dL    Comment: Glucose reference range applies only to samples taken after fasting for at least 8 hours.   BUN 21 8 - 23 mg/dL   Creatinine, Ser 0.79 0.44 - 1.00 mg/dL   Calcium 8.3 (L) 8.9 - 10.3 mg/dL   GFR, Estimated >60 >60 mL/min    Comment: (NOTE) Calculated using the CKD-EPI Creatinine Equation (2021)    Anion gap 7 5 - 15    Comment: Performed at [facility], [address] [place], [place] [address]  CBC     Status: Abnormal   Collection Time: 03/07/21  5:02 AM  Result Value Ref Range   WBC 10.8 (H) 4.0 - 10.5 K/uL   RBC 4.24 3.87 - 5.11 MIL/uL   Hemoglobin 12.8 12.0 - 15.0 g/dL   HCT 39.1 36.0 - 46.0 %    MCV 92.2 80.0 - 100.0 fL   MCH 30.2 26.0 - 34.0 pg   MCHC 32.7 30.0 - 36.0 g/dL   RDW 13.2 11.5 - 15.5 %   Platelets 233 150 - 400 K/uL   nRBC 0.0 0.0 - 0.2 %    Comment: Performed at [facility], [address], [place], [place] [address]  Glucose, capillary     Status: Abnormal   Collection Time: 03/07/21  7:32 AM  Result Value Ref Range   Glucose-Capillary 157 (H) 70 - 99 mg/dL    Comment: Glucose reference range applies only to samples taken after fasting for at least 8 hours.   Imaging:   CT ABDOMEN PELVIS WO CONTRAST  Result Date: 03/06/2021 CLINICAL DATA:  Abdominal pain, acute, nonlocalized; 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 RADIATION DOSE REDUCTION: This exam was performed according to the departmental dose-optimization program which includes automated exposure control, adjustment of the mA and/or kV according to patient size and/or use of iterative reconstruction technique. COMPARISON:  Most recent prior study is from 2009 FINDINGS: Lower chest: No acute abnormality. Hepatobiliary: Partial right lobe resection. No focal liver abnormality. Cholecystectomy. No unexpected biliary dilatation. Pancreas: Unremarkable. Spleen: Unremarkable. Adrenals/Urinary Tract: Adrenals are unremarkable. Kidneys and bladder are unremarkable. Stomach/Bowel: Stomach is within normal limits. There are mildly dilated loops of fluid-filled  small bowel. A fecalized loop is present in the right abdomen (series 5, image 31) and may reflect transition point. More proximal and distal small bowel loops are relatively decompressed. Colon is normal in caliber. There is a colonic anastomosis at the level of the sigmoid. Normal appendix. Vascular/Lymphatic: Minor atherosclerosis.  No enlarged nodes. Reproductive: Uterus and bilateral adnexa  are unremarkable. Other: Trace perisplenic ascites. Abdominal wall is unremarkable. Retained intraperitoneal in catheter or drain. Musculoskeletal: Degenerative changes of the lumbar spine. IMPRESSION: Partial small bowel obstruction with mildly dilated loops. Transition point may be at a fecalized loop in the right abdomen. More proximal in distal small bowel loops are relatively decompressed. Electronically Signed   By: Macy Mis M.D.   On: 03/06/2021 14:05    ROS:  Pertinent items noted in HPI and remainder of comprehensive ROS otherwise negative.  Blood pressure (!) 142/79, pulse 87, temperature 98.9 F (37.2 C), temperature source Oral, resp. rate 17, height 5\' 2"  (1.575 m), weight 90.7 kg, SpO2 95 %.  Physical Exam:  General: Pleasant appearing female in NAD  Head: Atraumatic, normocephalic CV: RRR, no murmurs heard  Resp: Lungs CTAB, normal WOB Abd: Soft, tender to palpation in RUQ.  Hyperactive bowel sounds in RUQ. Hypoactive bowel sounds in other quadrants. Prior surgical scars present. No masses palpated.  MSK: No LE edema Skin: No rashes seen  Neuro: A&Ox3  Psych: Appropriate mood and affect   Assessment/Plan:  #Partial SBO  #Iohexol allergy  Patient with history of colon cancer and multiple abdominal surgeries presents with acute onset vomiting and constipation. CT shows relatively normal stomach and proximal small bowel with moderate distention of small bowel in R upper quadrant, consistent with partial SBO. Patient's partial SBO likely 2/2 to adhesions from prior abdominal surgeries. Retained tubing seen on CT abdomen believed to be from hepatic arterial infusion for liver mets. Reviewed imaging with radiologist, retained tube does not appear to be contributing to SBO. Will plan for formal small bowel follow-through with barium instead of gastrografin due to patient's history of potential iodine allergy, and continue non-operative management at this time.  -Small bowel  follow-through with barium ordered  -Continue IV fluids and IV Dilaudid per medicine   #Hx colon cancer  Encouraged patient to receive colonoscopy as she has not gotten one in 10 years.   Stefani Dama Medical Student  03/07/2021, 9:47 AM

## 2021-03-07 NOTE — Progress Notes (Signed)
PROGRESS NOTE    Maria Luna  XBL:[id number] DOB: Nov 06, 1960 DOA: 03/06/2021 PCP: Chesley Noon, MD   Brief Narrative:   Maria Luna is a 61 y.o. female with medical history significant for colon cancer, diabetes mellitus, hypertension. Presented to the ED with complaints of sudden onset of diffuse abdominal pain that started this morning at about 7 AM.  She reports at least 5 episodes of vomiting since then.  Patient has been admitted with partial small bowel obstruction and further imaging is pending per general surgery.  Assessment & Plan:   Principal Problem:   Partial small bowel obstruction (HCC) Active Problems:   DM (diabetes mellitus) (HCC)   HTN (hypertension)   ADHD   Colon cancer (HCC)   Depression   Partial small bowel obstruction -In the setting of multiple abdominal surgeries -Continue IV fluid -Continue n.p.o. -Dilaudid as needed for pain control -Appreciate general surgery evaluation with recommendations for further small bowel imaging today  History of colon cancer -With prior colectomy, colostomy and then reversal  Hypertension-stable -Hold lisinopril for now  Uncontrolled diabetes -Hemoglobin A1c 7.7% -Holding home metformin and gabapentin -SSI every 4 hours  ADHD/depression -Holding Adderall   DVT prophylaxis: Heparin Code Status: Full Family Communication: None at bedside Disposition Plan:  Status is: Observation  The patient will require care spanning > 2 midnights and should be moved to inpatient because: Need for IV medications and further evaluation.   Consultants:  General surgery  Procedures:  See below  Antimicrobials:  None   Subjective: Patient seen and evaluated today with ongoing abdominal pain that is in the mid abdominal region.  She denies any further nausea or vomiting at this time.  Objective: Vitals:   03/06/21 1812 03/06/21 2127 03/07/21 0204 03/07/21 0534  BP: (!) 158/82 124/71 (!) 147/81  (!) 142/79  Pulse: 92 84 81 87  Resp: 20 17 16 17   Temp: 99.1 F (37.3 C) 99.4 F (37.4 C) 98.6 F (37 C) 98.9 F (37.2 C)  TempSrc: Oral Oral Oral Oral  SpO2: 97% 95% 95% 95%  Weight:      Height:        Intake/Output Summary (Last 24 hours) at 03/07/2021 1005 Last data filed at 03/07/2021 0400 Gross per 24 hour  Intake 880.15 ml  Output --  Net 880.15 ml   Filed Weights   03/06/21 1152  Weight: 90.7 kg    Examination:  General exam: Appears calm and comfortable, obese Respiratory system: Clear to auscultation. Respiratory effort normal. Cardiovascular system: S1 & S2 heard, RRR.  Gastrointestinal system: Abdomen is soft, minimally tender to palpation Central nervous system: Alert and awake Extremities: No edema Skin: No significant lesions noted Psychiatry: Flat affect.    Data Reviewed: I have personally reviewed following labs and imaging studies  CBC: Recent Labs  Lab 03/06/21 1215 03/07/21 0502  WBC 18.2* 10.8*  HGB 16.0* 12.8  HCT 46.4* 39.1  MCV 92.6 92.2  PLT 253 300   Basic Metabolic Panel: Recent Labs  Lab 03/06/21 1215 03/07/21 0502  NA 134* 138  K 4.1 4.1  CL 102 108  CO2 23 23  GLUCOSE 230* 136*  BUN 22 21  CREATININE 1.01* 0.79  CALCIUM 9.1 8.3*   GFR: Estimated Creatinine Clearance: 77.3 mL/min (by C-G formula based on SCr of 0.79 mg/dL). Liver Function Tests: Recent Labs  Lab 03/06/21 1215  AST 21  ALT 17  ALKPHOS 77  BILITOT 1.2  PROT 7.5  ALBUMIN 4.2  Recent Labs  Lab 03/06/21 1215  LIPASE 29   No results for input(s): AMMONIA in the last 168 hours. Coagulation Profile: No results for input(s): INR, PROTIME in the last 168 hours. Cardiac Enzymes: No results for input(s): CKTOTAL, CKMB, CKMBINDEX, TROPONINI in the last 168 hours. BNP (last 3 results) No results for input(s): PROBNP in the last 8760 hours. HbA1C: Recent Labs    03/06/21 1215  HGBA1C 7.7*   CBG: Recent Labs  Lab 03/06/21 2002  03/07/21 0026 03/07/21 0358 03/07/21 0732  GLUCAP 143* 132* 132* 157*   Lipid Profile: No results for input(s): CHOL, HDL, LDLCALC, TRIG, CHOLHDL, LDLDIRECT in the last 72 hours. Thyroid Function Tests: No results for input(s): TSH, T4TOTAL, FREET4, T3FREE, THYROIDAB in the last 72 hours. Anemia Panel: No results for input(s): VITAMINB12, FOLATE, FERRITIN, TIBC, IRON, RETICCTPCT in the last 72 hours. Sepsis Labs: No results for input(s): PROCALCITON, LATICACIDVEN in the last 168 hours.  Recent Results (from the past 240 hour(s))  Resp Panel by RT-PCR (Flu A&B, Covid) Nasopharyngeal Swab     Status: None   Collection Time: 03/06/21  4:16 PM   Specimen: Nasopharyngeal Swab; Nasopharyngeal(NP) swabs in vial transport medium  Result Value Ref Range Status   SARS Coronavirus 2 by RT PCR NEGATIVE NEGATIVE Final    Comment: (NOTE) SARS-CoV-2 target nucleic acids are NOT DETECTED.  The SARS-CoV-2 RNA is generally detectable in upper respiratory specimens during the acute phase of infection. The lowest concentration of SARS-CoV-2 viral copies this assay can detect is 138 copies/mL. A negative result does not preclude SARS-Cov-2 infection and should not be used as the sole basis for treatment or other patient management decisions. A negative result may occur with  improper specimen collection/handling, submission of specimen other than nasopharyngeal swab, presence of viral mutation(s) within the areas targeted by this assay, and inadequate number of viral copies(<138 copies/mL). A negative result must be combined with clinical observations, patient history, and epidemiological information. The expected result is Negative.  Fact Sheet for Patients:  EntrepreneurPulse.com.au  Fact Sheet for Healthcare Providers:  IncredibleEmployment.be  This test is no t yet approved or cleared by the [place] FDA and  has been authorized for detection and/or  diagnosis of SARS-CoV-2 by FDA under an Emergency Use Authorization (EUA). This EUA will remain  in effect (meaning this test can be used) for the duration of the COVID-19 declaration under Section 564(b)(1) of the Act, 21 U.S.C.section 360bbb-3(b)(1), unless the authorization is terminated  or revoked sooner.       Influenza A by PCR NEGATIVE NEGATIVE Final   Influenza B by PCR NEGATIVE NEGATIVE Final    Comment: (NOTE) The Xpert Xpress SARS-CoV-2/FLU/RSV plus assay is intended as an aid in the diagnosis of influenza from Nasopharyngeal swab specimens and should not be used as a sole basis for treatment. Nasal washings and aspirates are unacceptable for Xpert Xpress SARS-CoV-2/FLU/RSV testing.  Fact Sheet for Patients: EntrepreneurPulse.com.au  Fact Sheet for Healthcare Providers: IncredibleEmployment.be  This test is not yet approved or cleared by the [place] FDA and has been authorized for detection and/or diagnosis of SARS-CoV-2 by FDA under an Emergency Use Authorization (EUA). This EUA will remain in effect (meaning this test can be used) for the duration of the COVID-19 declaration under Section 564(b)(1) of the Act, 21 U.S.C. section 360bbb-3(b)(1), unless the authorization is terminated or revoked.  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 RADIATION DOSE REDUCTION: This exam was performed according to the departmental dose-optimization program which includes automated exposure control, adjustment of the mA and/or kV according to patient size and/or  use of iterative reconstruction technique. COMPARISON:  Most recent prior study is from 2009 FINDINGS: Lower chest: No acute abnormality. Hepatobiliary: Partial right lobe resection. No focal liver abnormality. Cholecystectomy. No unexpected biliary dilatation. Pancreas: Unremarkable. Spleen: Unremarkable. Adrenals/Urinary Tract: Adrenals are unremarkable. Kidneys and bladder are unremarkable. Stomach/Bowel: Stomach is within normal limits. There are mildly dilated loops of fluid-filled small bowel. A fecalized loop is present in the right abdomen (series 5, image 31) and may reflect transition point. More proximal and distal small bowel loops are relatively decompressed. Colon is normal in caliber. There is a colonic anastomosis at the level of the sigmoid. Normal appendix. Vascular/Lymphatic: Minor atherosclerosis.  No enlarged nodes. Reproductive: Uterus and bilateral adnexa are unremarkable. Other: Trace perisplenic ascites. Abdominal wall is unremarkable. Retained intraperitoneal in catheter or drain. Musculoskeletal: Degenerative changes of the lumbar spine. IMPRESSION: Partial small bowel obstruction with mildly dilated loops. Transition point may be at a fecalized loop in the right abdomen. More proximal in distal small bowel loops are relatively decompressed. Electronically Signed   By: Macy Mis M.D.   On: 03/06/2021 14:05        Scheduled Meds:  heparin  5,000 Units Subcutaneous Q8H   influenza vac split quadrivalent PF  0.5 mL Intramuscular Tomorrow-1000   insulin aspart  0-9 Units Subcutaneous Q4H   Continuous Infusions:  sodium chloride 100 mL/hr at 03/07/21 0721     LOS: 0 days    Time spent: 35 minutes    Socorro Kanitz Darleen Crocker, DO [facility]  If 7PM-7AM, please contact night-coverage [email or web address] 03/07/2021, 10:05 AM

## 2021-03-08 LAB — CBC
HCT: 38.8 % (ref 36.0–46.0)
Hemoglobin: 13 g/dL (ref 12.0–15.0)
MCH: 31.6 pg (ref 26.0–34.0)
MCHC: 33.5 g/dL (ref 30.0–36.0)
MCV: 94.4 fL (ref 80.0–100.0)
Platelets: 226 10*3/uL (ref 150–400)
RBC: 4.11 MIL/uL (ref 3.87–5.11)
RDW: 13 % (ref 11.5–15.5)
WBC: 7 10*3/uL (ref 4.0–10.5)
nRBC: 0 % (ref 0.0–0.2)

## 2021-03-08 LAB — BASIC METABOLIC PANEL
Anion gap: 8 (ref 5–15)
BUN: 9 mg/dL (ref 8–23)
CO2: 25 mmol/L (ref 22–32)
Calcium: 8.8 mg/dL — ABNORMAL LOW (ref 8.9–10.3)
Chloride: 104 mmol/L (ref 98–111)
Creatinine, Ser: 0.81 mg/dL (ref 0.44–1.00)
GFR, Estimated: >60 mL/min (ref >60)
Glucose, Bld: 140 mg/dL — ABNORMAL HIGH (ref 70–99)
Potassium: 4 mmol/L (ref 3.5–5.1)
Sodium: 137 mmol/L (ref 135–145)

## 2021-03-08 LAB — GLUCOSE, CAPILLARY
Glucose-Capillary: 117 mg/dL — ABNORMAL HIGH (ref 70–99)
Glucose-Capillary: 120 mg/dL — ABNORMAL HIGH (ref 70–99)
Glucose-Capillary: 132 mg/dL — ABNORMAL HIGH (ref 70–99)
Glucose-Capillary: 147 mg/dL — ABNORMAL HIGH (ref 70–99)
Glucose-Capillary: 176 mg/dL — ABNORMAL HIGH (ref 70–99)
Glucose-Capillary: 183 mg/dL — ABNORMAL HIGH (ref 70–99)
Glucose-Capillary: 205 mg/dL — ABNORMAL HIGH (ref 70–99)

## 2021-03-08 LAB — MAGNESIUM: Magnesium: 1.4 mg/dL — ABNORMAL LOW (ref 1.7–2.4)

## 2021-03-08 MED ORDER — ROSUVASTATIN CALCIUM 10 MG PO TABS
10.0000 mg | ORAL_TABLET | Freq: Every day | ORAL | Status: DC
  Administered 2021-03-08 – 2021-03-09 (×2): 10 mg via ORAL
  Filled 2021-03-08 (×2): qty 1

## 2021-03-08 MED ORDER — AMPHETAMINE-DEXTROAMPHETAMINE 10 MG PO TABS
10.0000 mg | ORAL_TABLET | Freq: Two times a day (BID) | ORAL | Status: DC
  Administered 2021-03-08 – 2021-03-09 (×2): 10 mg via ORAL
  Filled 2021-03-08 (×2): qty 1

## 2021-03-08 MED ORDER — MAGNESIUM SULFATE 2 GM/50ML IV SOLN
2.0000 g | Freq: Once | INTRAVENOUS | Status: AC
  Administered 2021-03-08: 2 g via INTRAVENOUS
  Filled 2021-03-08: qty 50

## 2021-03-08 MED ORDER — MELATONIN 3 MG PO TABS
6.0000 mg | ORAL_TABLET | Freq: Once | ORAL | Status: AC
  Administered 2021-03-08: 6 mg via ORAL
  Filled 2021-03-08: qty 2

## 2021-03-08 MED ORDER — GABAPENTIN 100 MG PO CAPS
100.0000 mg | ORAL_CAPSULE | Freq: Three times a day (TID) | ORAL | Status: DC
  Administered 2021-03-08 – 2021-03-09 (×3): 100 mg via ORAL
  Filled 2021-03-08 (×4): qty 1

## 2021-03-08 MED ORDER — LISINOPRIL 10 MG PO TABS
10.0000 mg | ORAL_TABLET | Freq: Every day | ORAL | Status: DC
  Administered 2021-03-08 – 2021-03-09 (×2): 10 mg via ORAL
  Filled 2021-03-08 (×2): qty 1

## 2021-03-08 MED ORDER — AMPHETAMINE-DEXTROAMPHETAMINE 10 MG PO TABS: 10.0000 mg | ORAL_TABLET | Freq: Four times a day (QID) | ORAL | Status: DC

## 2021-03-08 MED ORDER — FLUOXETINE HCL 20 MG PO CAPS
20.0000 mg | ORAL_CAPSULE | Freq: Every day | ORAL | Status: DC
  Administered 2021-03-08 – 2021-03-09 (×2): 20 mg via ORAL
  Filled 2021-03-08 (×2): qty 1

## 2021-03-08 NOTE — Progress Notes (Signed)
[facility] Progress Note     Subjective: Had a small BM. Tolerating some fluids.   Objective: Vital signs in last 24 hours: Temp:  [97.9 F (36.6 C)-98.5 F (36.9 C)] 97.9 F (36.6 C) (01/13 1302) Pulse Rate:  [65-70] 67 (01/13 1302) Resp:  [17-20] 17 (01/13 1302) BP: (140-178)/(66-84) 178/84 (01/13 1302) SpO2:  [93 %-96 %] 96 % (01/13 1302) Last BM Date: 03/05/21  Intake/Output from previous day: 01/12 0701 - 01/13 0700 In: 826.5 [P.O.:200; I.V.:626.5] Out: -  Intake/Output this shift: Total I/O In: 480 [P.O.:480] Out: -   General appearance: alert and no distress GI: soft, mildly distended, minimally tender   Lab Results:  Recent Labs    03/07/21 0502 03/08/21 0512  WBC 10.8* 7.0  HGB 12.8 13.0  HCT 39.1 38.8  PLT 233 226   BMET Recent Labs    03/07/21 0502 03/08/21 0512  NA 138 137  K 4.1 4.0  CL 108 104  CO2 23 25  GLUCOSE 136* 140*  BUN 21 9  CREATININE 0.79 0.81  CALCIUM 8.3* 8.8*   PT/INR No results for input(s): LABPROT, INR in the last 72 hours.  Studies/Results: DG SMALL BOWEL W SINGLE CM (SOL OR THIN BA)  Result Date: 03/07/2021 CLINICAL DATA:  Small-bowel obstruction, abdominal pain EXAM: SMALL BOWEL SERIES COMPARISON:  CT abdomen and pelvis 03/06/2021 TECHNIQUE: Following ingestion of thin barium, serial small bowel images were obtained including spot views of the terminal ileum. FLUOROSCOPY TIME:  Fluoroscopy Time: Not recorded by device, technical failure Radiation Exposure Index (if provided by the fluoroscopic device): Recorded value 95.2 mGy, uncertain if accurate since fluoroscopy time not known Number of Acquired Spot Images: 9 + multiple fluoroscopic screen captures FINDINGS: Normal bowel gas pattern on scout image. Small bowel dilatation seen on the preceding CT exam appears decreased. Surgical clips RIGHT upper quadrant, RIGHT mid abdomen and RIGHT pelvis with tubing fragment again seen in the RIGHT and mid  abdomen. No gastric outlet obstruction. Contrast passes from stomach to colon by 4 hours. Tiny duodenal diverticulum noted. Two short loops of small bowel in the RIGHT mid abdomen demonstrate mild dilatation without wall thickening. Remaining small bowel loops are normal in caliber without obstruction. Contrast passes to colon at 4 hours where small amount retained stool is seen. No focal abnormalities of small bowel loops are seen on compression views. IMPRESSION: Two short small bowel loops in the RIGHT mid abdomen demonstrate minimal distension without wall thickening or obstruction. Contrast passes from oral cavity to colon by 4 hours without evidence of small-bowel obstruction or definite wall thickening. Electronically Signed   By: Lavonia Dana M.D.   On: 03/07/2021 16:18    Anti-infectives: Anti-infectives (From admission, onward)    None       Assessment/Plan: Maria Luna is a 60 yo who had a SBFT without any obstruction. Diet as tolerated Ambulate   LOS: 1 day    Maria Luna 03/08/2021

## 2021-03-08 NOTE — Progress Notes (Signed)
PROGRESS NOTE    [name]  NOB:[id number] DOB: 31-Dec-1960 DOA: 03/06/2021 PCP: Chesley Noon, MD   Brief Narrative:   Maria Luna is a 61 y.o. female with medical history significant for colon cancer, diabetes mellitus, hypertension. Presented to the ED with complaints of sudden onset of diffuse abdominal pain that started this morning at about 7 AM.  She reports at least 5 episodes of vomiting since then.  Patient has been admitted with partial small bowel obstruction and has been advanced to clear liquid diet per general surgery.  Assessment & Plan:   Principal Problem:   Partial small bowel obstruction (HCC) Active Problems:   DM (diabetes mellitus) (HCC)   HTN (hypertension)   ADHD   Colon cancer (HCC)   Depression   Partial small bowel obstruction-slowly resolving -In the setting of multiple abdominal surgeries -Continue IV fluid -Continue clear liquids and advance per general surgery -Dilaudid as needed for pain control -Appreciate general surgery ongoing evaluation   History of colon cancer -With prior colectomy, colostomy and then reversal   Hypertension-starting to elevate -Resume home lisinopril today   Uncontrolled diabetes -Hemoglobin A1c 7.7% -Holding home metformin and gabapentin -SSI every 4 hours   ADHD/depression -Holding Adderall     DVT prophylaxis: Heparin Code Status: Full Family Communication: None at bedside Disposition Plan:  Status is: Inpatient  Remains inpatient appropriate because: Need for IV fluid.     Consultants:  General surgery   Procedures:  See below   Antimicrobials:  None   Subjective: Patient seen and evaluated today with improvement in her abdominal pain noted.  No further nausea or vomiting noted and she is tolerating clear liquid diet.  She is passing some flatus, but has not had a bowel movement yet.  Objective: Vitals:   03/07/21 0204 03/07/21 0534 03/07/21 2120 03/08/21 0524  BP: (!)  147/81 (!) 142/79 140/66 (!) 175/71  Pulse: 81 87 70 65  Resp: 16 17 18 20   Temp: 98.6 F (37 C) 98.9 F (37.2 C) 98.4 F (36.9 C) 98.5 F (36.9 C)  TempSrc: Oral Oral Oral Oral  SpO2: 95% 95% 93% 94%  Weight:      Height:        Intake/Output Summary (Last 24 hours) at 03/08/2021 1114 Last data filed at 03/08/2021 0900 Gross per 24 hour  Intake 1066.54 ml  Output --  Net 1066.54 ml   Filed Weights   03/06/21 1152  Weight: 90.7 kg    Examination:  General exam: Appears calm and comfortable  Respiratory system: Clear to auscultation. Respiratory effort normal. Cardiovascular system: S1 & S2 heard, RRR.  Gastrointestinal system: Abdomen is soft Central nervous system: Alert and awake Extremities: No edema Skin: No significant lesions noted Psychiatry: Flat affect.    Data Reviewed: I have personally reviewed following labs and imaging studies  CBC: Recent Labs  Lab 03/06/21 1215 03/07/21 0502 03/08/21 0512  WBC 18.2* 10.8* 7.0  HGB 16.0* 12.8 13.0  HCT 46.4* 39.1 38.8  MCV 92.6 92.2 94.4  PLT 253 233 947   Basic Metabolic Panel: Recent Labs  Lab 03/06/21 1215 03/07/21 0502 03/08/21 0512  NA 134* 138 137  K 4.1 4.1 4.0  CL 102 108 104  CO2 23 23 25   GLUCOSE 230* 136* 140*  BUN 22 21 9   CREATININE 1.01* 0.79 0.81  CALCIUM 9.1 8.3* 8.8*  MG  --   --  1.4*   GFR: Estimated Creatinine Clearance: 76.3 mL/min (by  C-G formula based on SCr of 0.81 mg/dL). Liver Function Tests: Recent Labs  Lab 03/06/21 1215  AST 21  ALT 17  ALKPHOS 77  BILITOT 1.2  PROT 7.5  ALBUMIN 4.2   Recent Labs  Lab 03/06/21 1215  LIPASE 29   No results for input(s): AMMONIA in the last 168 hours. Coagulation Profile: No results for input(s): INR, PROTIME in the last 168 hours. Cardiac Enzymes: No results for input(s): CKTOTAL, CKMB, CKMBINDEX, TROPONINI in the last 168 hours. BNP (last 3 results) No results for input(s): PROBNP in the last 8760  hours. HbA1C: Recent Labs    03/06/21 1215  HGBA1C 7.7*   CBG: Recent Labs  Lab 03/07/21 1639 03/07/21 1956 03/08/21 0044 03/08/21 0417 03/08/21 0716  GLUCAP 122* 141* 117* 147* 120*   Lipid Profile: No results for input(s): CHOL, HDL, LDLCALC, TRIG, CHOLHDL, LDLDIRECT in the last 72 hours. Thyroid Function Tests: No results for input(s): TSH, T4TOTAL, FREET4, T3FREE, THYROIDAB in the last 72 hours. Anemia Panel: No results for input(s): VITAMINB12, FOLATE, FERRITIN, TIBC, IRON, RETICCTPCT in the last 72 hours. Sepsis Labs: No results for input(s): PROCALCITON, LATICACIDVEN in the last 168 hours.  Recent Results (from the past 240 hour(s))  Resp Panel by RT-PCR (Flu A&B, Covid) Nasopharyngeal Swab     Status: None   Collection Time: 03/06/21  4:16 PM   Specimen: Nasopharyngeal Swab; Nasopharyngeal(NP) swabs in vial transport medium  Result Value Ref Range Status   SARS Coronavirus 2 by RT PCR NEGATIVE NEGATIVE Final    Comment: (NOTE) SARS-CoV-2 target nucleic acids are NOT DETECTED.  The SARS-CoV-2 RNA is generally detectable in upper respiratory specimens during the acute phase of infection. The lowest concentration of SARS-CoV-2 viral copies this assay can detect is 138 copies/mL. A negative result does not preclude SARS-Cov-2 infection and should not be used as the sole basis for treatment or other patient management decisions. A negative result may occur with  improper specimen collection/handling, submission of specimen other than nasopharyngeal swab, presence of viral mutation(s) within the areas targeted by this assay, and inadequate number of viral copies(<138 copies/mL). A negative result must be combined with clinical observations, patient history, and epidemiological information. The expected result is Negative.  Fact Sheet for Patients:  EntrepreneurPulse.com.au  Fact Sheet for Healthcare Providers:   IncredibleEmployment.be  This test is no t yet approved or cleared by the [place] FDA and  has been authorized for detection and/or diagnosis of SARS-CoV-2 by FDA under an Emergency Use Authorization (EUA). This EUA will remain  in effect (meaning this test can be used) for the duration of the COVID-19 declaration under Section 564(b)(1) of the Act, 21 U.S.C.section 360bbb-3(b)(1), unless the authorization is terminated  or revoked sooner.       Influenza A by PCR NEGATIVE NEGATIVE Final   Influenza B by PCR NEGATIVE NEGATIVE Final    Comment: (NOTE) The Xpert Xpress SARS-CoV-2/FLU/RSV plus assay is intended as an aid in the diagnosis of influenza from Nasopharyngeal swab specimens and should not be used as a sole basis for treatment. Nasal washings and aspirates are unacceptable for Xpert Xpress SARS-CoV-2/FLU/RSV testing.  Fact Sheet for Patients: EntrepreneurPulse.com.au  Fact Sheet for Healthcare Providers: IncredibleEmployment.be  This test is not yet approved or cleared by the [place] FDA and has been authorized for detection and/or diagnosis of SARS-CoV-2 by FDA under an Emergency Use Authorization (EUA). This EUA will remain in effect (meaning this test can be used) for the duration  of the COVID-19 declaration under Section 564(b)(1) of the Act, 21 U.S.C. section 360bbb-3(b)(1), unless the authorization is terminated or revoked.  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 RADIATION DOSE REDUCTION: This exam was performed  according to the departmental dose-optimization program which includes automated exposure control, adjustment of the mA and/or kV according to patient size and/or use of iterative reconstruction technique. COMPARISON:  Most recent prior study is from 2009 FINDINGS: Lower chest: No acute abnormality. Hepatobiliary: Partial right lobe resection. No focal liver abnormality. Cholecystectomy. No unexpected biliary dilatation. Pancreas: Unremarkable. Spleen: Unremarkable. Adrenals/Urinary Tract: Adrenals are unremarkable. Kidneys and bladder are unremarkable. Stomach/Bowel: Stomach is within normal limits. There are mildly dilated loops of fluid-filled small bowel. A fecalized loop is present in the right abdomen (series 5, image 31) and may reflect transition point. More proximal and distal small bowel loops are relatively decompressed. Colon is normal in caliber. There is a colonic anastomosis at the level of the sigmoid. Normal appendix. Vascular/Lymphatic: Minor atherosclerosis.  No enlarged nodes. Reproductive: Uterus and bilateral adnexa are unremarkable. Other: Trace perisplenic ascites. Abdominal wall is unremarkable. Retained intraperitoneal in catheter or drain. Musculoskeletal: Degenerative changes of the lumbar spine. IMPRESSION: Partial small bowel obstruction with mildly dilated loops. Transition point may be at a fecalized loop in the right abdomen. More proximal in distal small bowel loops are relatively decompressed. Electronically Signed   By: Macy Mis M.D.   On: 03/06/2021 14:05   DG SMALL BOWEL W SINGLE CM (SOL OR THIN BA)  Result Date: 03/07/2021 CLINICAL DATA:  Small-bowel obstruction, abdominal pain EXAM: SMALL BOWEL SERIES COMPARISON:  CT abdomen and pelvis 03/06/2021 TECHNIQUE: Following ingestion of thin barium, serial small bowel images were obtained including spot views of the terminal ileum. FLUOROSCOPY TIME:  Fluoroscopy Time: Not recorded by device, technical failure Radiation  Exposure Index (if provided by the fluoroscopic device): Recorded value 42.7 mGy, uncertain if accurate since fluoroscopy time not known Number of Acquired Spot Images: 9 + multiple fluoroscopic screen captures FINDINGS: Normal bowel gas pattern on scout image. Small bowel dilatation seen on the preceding CT exam appears decreased. Surgical clips RIGHT upper quadrant, RIGHT mid abdomen and RIGHT pelvis with tubing fragment again seen in the RIGHT and mid abdomen. No gastric outlet obstruction. Contrast passes from stomach to colon by 4 hours. Tiny duodenal diverticulum noted. Two short loops of small bowel in the RIGHT mid abdomen demonstrate mild dilatation without wall thickening. Remaining small bowel loops are normal in caliber without obstruction. Contrast passes to colon at 4 hours where small amount retained stool is seen. No focal abnormalities of small bowel loops are seen on compression views. IMPRESSION: Two short small bowel loops in the RIGHT mid abdomen demonstrate minimal distension without wall thickening or obstruction. Contrast passes from oral cavity to colon by 4 hours without evidence of small-bowel obstruction or definite wall thickening. Electronically Signed   By: Lavonia Dana M.D.   On: 03/07/2021 16:18        Scheduled Meds:  heparin  5,000 Units Subcutaneous Q8H   insulin aspart  0-9 Units Subcutaneous Q4H  LOS: 1 day    Time spent: 35 minutes    Antar Milks Darleen Crocker, DO [facility]  If 7PM-7AM, please contact night-coverage [email or web address] 03/08/2021, 11:14 AM

## 2021-03-09 LAB — BASIC METABOLIC PANEL
Anion gap: 6 (ref 5–15)
BUN: 9 mg/dL (ref 8–23)
CO2: 28 mmol/L (ref 22–32)
Calcium: 8.7 mg/dL — ABNORMAL LOW (ref 8.9–10.3)
Chloride: 105 mmol/L (ref 98–111)
Creatinine, Ser: 0.88 mg/dL (ref 0.44–1.00)
GFR, Estimated: >60 mL/min (ref >60)
Glucose, Bld: 158 mg/dL — ABNORMAL HIGH (ref 70–99)
Potassium: 3.8 mmol/L (ref 3.5–5.1)
Sodium: 139 mmol/L (ref 135–145)

## 2021-03-09 LAB — GLUCOSE, CAPILLARY
Glucose-Capillary: 158 mg/dL — ABNORMAL HIGH (ref 70–99)
Glucose-Capillary: 128 mg/dL — ABNORMAL HIGH (ref 70–99)
Glucose-Capillary: 181 mg/dL — ABNORMAL HIGH (ref 70–99)

## 2021-03-09 LAB — MAGNESIUM: Magnesium: 1.8 mg/dL (ref 1.7–2.4)

## 2021-03-09 LAB — CBC
HCT: 39.1 % (ref 36.0–46.0)
Hemoglobin: 12.6 g/dL (ref 12.0–15.0)
MCH: 30.1 pg (ref 26.0–34.0)
MCHC: 32.2 g/dL (ref 30.0–36.0)
MCV: 93.5 fL (ref 80.0–100.0)
Platelets: 231 10*3/uL (ref 150–400)
RBC: 4.18 MIL/uL (ref 3.87–5.11)
RDW: 13 % (ref 11.5–15.5)
WBC: 7.4 10*3/uL (ref 4.0–10.5)
nRBC: 0 % (ref 0.0–0.2)

## 2021-03-09 MED ORDER — ONDANSETRON HCL 4 MG PO TABS: 4.0000 mg | ORAL_TABLET | Freq: Four times a day (QID) | ORAL | 0 refills | Status: AC | PRN

## 2021-03-09 NOTE — Progress Notes (Signed)
Pt discharged to home. DC instructions given with husband ar bedside. No concerns voiced. Pt reminded to stop by pharmacy and pick up antiemetic e-prescribed by Provider. Acknowledged same. Left unit in wheelchair pushed by [place], Smiley. Left in stable condition. VWilliams, Therapist, sports.

## 2021-03-09 NOTE — Discharge Summary (Signed)
Physician Discharge Summary  [name] QPY:[id number] DOB: 15-Oct-1960 DOA: 03/06/2021  PCP: Chesley Noon, MD  Admit date: 03/06/2021  Discharge date: 03/09/2021  Admitted From:Home  Disposition:  Home  Recommendations for Outpatient Follow-up:  Follow up with PCP in 1-2 weeks Zofran prescribed as needed for any nausea or vomiting Continue home medications as noted below  Home Health: None  Equipment/Devices: None  Discharge Condition:Stable  CODE STATUS: Full  Diet recommendation: Heart Healthy/carb modified  Brief/Interim Summary: Maria Luna is a 61 y.o. female with medical history significant for colon cancer, diabetes mellitus, hypertension. Presented to the ED with complaints of sudden onset of diffuse abdominal pain that started on the morning of admission.  She reports at least 5 episodes of vomiting since then.  Patient has been admitted with partial small bowel obstruction and she was seen by general surgery with slow advancement of her diet.  She had complete symptom resolution via conservative measures and is now tolerating diet with no further abdominal pain and is having bowel movements and denies any further nausea or vomiting.  She is in stable condition for discharge today with no other acute events noted throughout this brief admission.  Discharge Diagnoses:  Principal Problem:   Partial small bowel obstruction (HCC) Active Problems:   DM (diabetes mellitus) ([place])   HTN (hypertension)   ADHD   Colon cancer ([place])   Depression  Principal discharge diagnosis: Partial small bowel obstruction.  Discharge Instructions  Discharge Instructions     Diet - low sodium heart healthy   Complete by: As directed    Increase activity slowly   Complete by: As directed       Allergies as of 03/09/2021       Reactions   Iohexol     Desc: THROAT ITCHING AND TINGLING, NEEDS PRE-MEDS.        Medication List     STOP taking these medications     atovaquone-proguanil 250-100 MG Tabs tablet Commonly known as: MALARONE   azithromycin 500 MG tablet Commonly known as: ZITHROMAX   cyclobenzaprine 10 MG tablet Commonly known as: FLEXERIL   predniSONE 10 MG tablet Commonly known as: DELTASONE       TAKE these medications    amphetamine-dextroamphetamine 10 MG tablet Commonly known as: ADDERALL Take 10 mg by mouth 4 (four) times daily.   FLUoxetine 20 MG capsule Commonly known as: PROZAC Take 1 capsule by mouth daily.   gabapentin 100 MG capsule Commonly known as: NEURONTIN Take 1 capsule by mouth 3 (three) times daily.   HYDROcodone-acetaminophen 10-325 MG tablet Commonly known as: NORCO Take by mouth. What changed: Another medication with the same name was removed. Continue taking this medication, and follow the directions you see here.   lisinopril 10 MG tablet Commonly known as: ZESTRIL Take 10 mg by mouth daily.   meloxicam 7.5 MG tablet Commonly known as: Mobic 1 po bid with food   metFORMIN 500 MG 24 hr tablet Commonly known as: GLUCOPHAGE-XR Take 1,000 mg by mouth 2 (two) times daily.   ondansetron 4 MG tablet Commonly known as: ZOFRAN Take 1 tablet (4 mg total) by mouth every 6 (six) hours as needed for nausea.   rosuvastatin 10 MG tablet Commonly known as: CRESTOR Take by mouth.        Follow-up Information     Chesley Noon, MD. Schedule an appointment as soon as possible for a visit in 1 week(s).   Specialty: Family Medicine Contact  information: [place] [address] [phone]                Allergies  Allergen Reactions   Iohexol      Desc: THROAT ITCHING AND TINGLING, NEEDS PRE-MEDS.     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 RADIATION DOSE REDUCTION: This exam was performed according to the departmental dose-optimization program which includes automated exposure control, adjustment of the mA and/or kV according to patient size and/or use of iterative reconstruction technique. COMPARISON:  Most recent prior study is from 2009 FINDINGS: Lower chest: No acute abnormality. Hepatobiliary: Partial right lobe resection. No focal liver abnormality. Cholecystectomy. No unexpected biliary dilatation. Pancreas: Unremarkable. Spleen: Unremarkable. Adrenals/Urinary Tract: Adrenals are unremarkable. Kidneys and bladder are unremarkable. Stomach/Bowel: Stomach is within normal limits. There are mildly dilated loops of fluid-filled small bowel. A fecalized loop is present in the right abdomen (series 5, image 31) and may reflect transition point. More proximal and distal small bowel loops are relatively decompressed. Colon is normal in caliber. There is a colonic anastomosis at the level of the sigmoid. Normal appendix. Vascular/Lymphatic: Minor atherosclerosis.  No enlarged nodes. Reproductive: Uterus and bilateral adnexa are unremarkable. Other: Trace perisplenic ascites. Abdominal wall is unremarkable. Retained intraperitoneal in catheter or drain. Musculoskeletal: Degenerative changes of the lumbar spine. IMPRESSION: Partial small bowel obstruction with mildly dilated loops. Transition point may be at a fecalized loop in the right abdomen. More proximal in distal small bowel loops are relatively decompressed. Electronically Signed   By: Macy Mis M.D.   On: 03/06/2021 14:05   DG SMALL BOWEL W SINGLE CM (SOL OR THIN BA)  Result Date: 03/07/2021 CLINICAL DATA:  Small-bowel obstruction, abdominal pain EXAM: SMALL BOWEL SERIES COMPARISON:  CT abdomen and pelvis 03/06/2021 TECHNIQUE: Following ingestion of thin  barium, serial small bowel images were obtained including spot views of the terminal ileum. FLUOROSCOPY TIME:  Fluoroscopy Time: Not recorded by device, technical failure Radiation Exposure Index (if provided by the fluoroscopic device): Recorded value 32.3 mGy, uncertain if accurate since fluoroscopy time not known Number of Acquired Spot Images: 9 + multiple fluoroscopic screen captures FINDINGS: Normal bowel gas pattern on scout image. Small bowel dilatation seen on the preceding CT exam appears decreased. Surgical clips RIGHT upper quadrant, RIGHT mid abdomen and RIGHT pelvis with tubing fragment again seen in the RIGHT and mid abdomen. No gastric outlet obstruction. Contrast passes from stomach to colon by 4 hours. Tiny duodenal diverticulum noted. Two short loops of small bowel in the RIGHT mid abdomen demonstrate mild dilatation without wall thickening. Remaining small bowel loops are normal in caliber without obstruction. Contrast passes to colon at 4 hours where small amount retained stool is seen. No focal abnormalities of small bowel loops are seen on compression views. IMPRESSION: Two short small bowel loops in the RIGHT mid abdomen demonstrate minimal distension without wall thickening or obstruction. Contrast passes from oral cavity to colon by 4 hours without evidence of small-bowel obstruction or definite wall thickening. Electronically Signed   By: Lavonia Dana M.D.   On: 03/07/2021 16:18     Discharge Exam: Vitals:   03/08/21 2249 03/09/21 0405  BP: 127/80 (!) 155/78  Pulse:  63 63  Resp: 16 20  Temp: 98.5 F (36.9 C) 98.2 F (36.8 C)  SpO2: 97% 96%   Vitals:   03/08/21 0524 03/08/21 1302 03/08/21 2249 03/09/21 0405  BP: (!) 175/71 (!) 178/84 127/80 (!) 155/78  Pulse: 65 67 63 63  Resp: 20 17 16 20   Temp: 98.5 F (36.9 C) 97.9 F (36.6 C) 98.5 F (36.9 C) 98.2 F (36.8 C)  TempSrc: Oral  Oral   SpO2: 94% 96% 97% 96%  Weight:      Height:        General: Pt is alert,  awake, not in acute distress, obese Cardiovascular: RRR, S1/S2 +, no rubs, no gallops Respiratory: CTA bilaterally, no wheezing, no rhonchi Abdominal: Soft, NT, ND, bowel sounds + Extremities: no edema, no cyanosis    The results of significant diagnostics from this hospitalization (including imaging, microbiology, ancillary and laboratory) are listed below for reference.     Microbiology: Recent Results (from the past 240 hour(s))  Resp Panel by RT-PCR (Flu A&B, Covid) Nasopharyngeal Swab     Status: None   Collection Time: 03/06/21  4:16 PM   Specimen: Nasopharyngeal Swab; Nasopharyngeal(NP) swabs in vial transport medium  Result Value Ref Range Status   SARS Coronavirus 2 by RT PCR NEGATIVE NEGATIVE Final    Comment: (NOTE) SARS-CoV-2 target nucleic acids are NOT DETECTED.  The SARS-CoV-2 RNA is generally detectable in upper respiratory specimens during the acute phase of infection. The lowest concentration of SARS-CoV-2 viral copies this assay can detect is 138 copies/mL. A negative result does not preclude SARS-Cov-2 infection and should not be used as the sole basis for treatment or other patient management decisions. A negative result may occur with  improper specimen collection/handling, submission of specimen other than nasopharyngeal swab, presence of viral mutation(s) within the areas targeted by this assay, and inadequate number of viral copies(<138 copies/mL). A negative result must be combined with clinical observations, patient history, and epidemiological information. The expected result is Negative.  Fact Sheet for Patients:  EntrepreneurPulse.com.au  Fact Sheet for Healthcare Providers:  IncredibleEmployment.be  This test is no t yet approved or cleared by the [place] FDA and  has been authorized for detection and/or diagnosis of SARS-CoV-2 by FDA under an Emergency Use Authorization (EUA). This EUA will remain  in  effect (meaning this test can be used) for the duration of the COVID-19 declaration under Section 564(b)(1) of the Act, 21 U.S.C.section 360bbb-3(b)(1), unless the authorization is terminated  or revoked sooner.       Influenza A by PCR NEGATIVE NEGATIVE Final   Influenza B by PCR NEGATIVE NEGATIVE Final    Comment: (NOTE) The Xpert Xpress SARS-CoV-2/FLU/RSV plus assay is intended as an aid in the diagnosis of influenza from Nasopharyngeal swab specimens and should not be used as a sole basis for treatment. Nasal washings and aspirates are unacceptable for Xpert Xpress SARS-CoV-2/FLU/RSV testing.  Fact Sheet for Patients: EntrepreneurPulse.com.au  Fact Sheet for Healthcare Providers: IncredibleEmployment.be  This test is not yet approved or cleared by the [place] FDA and has been authorized for detection and/or diagnosis of SARS-CoV-2 by FDA under an Emergency Use Authorization (EUA). This EUA will remain in effect (meaning this test can be used) for the duration of the COVID-19 declaration under Section 564(b)(1) of the Act, 21 U.S.C. section 360bbb-3(b)(1), unless the authorization is terminated or revoked.  Performed at [facility], [address], [place], [place] [address]      Labs:  BNP (last 3 results) No results for input(s): BNP in the last 8760 hours. Basic Metabolic Panel: Recent Labs  Lab 03/06/21 1215 03/07/21 0502 03/08/21 0512 03/09/21 0521  NA 134* 138 137 139  K 4.1 4.1 4.0 3.8  CL 102 108 104 105  CO2 23 23 25 28   GLUCOSE 230* 136* 140* 158*  BUN 22 21 9 9   CREATININE 1.01* 0.79 0.81 0.88  CALCIUM 9.1 8.3* 8.8* 8.7*  MG  --   --  1.4* 1.8   Liver Function Tests: Recent Labs  Lab 03/06/21 1215  AST 21  ALT 17  ALKPHOS 77  BILITOT 1.2  PROT 7.5  ALBUMIN 4.2   Recent Labs  Lab 03/06/21 1215  LIPASE 29   No results for input(s): AMMONIA in the last 168 hours. CBC: Recent Labs  Lab  03/06/21 1215 03/07/21 0502 03/08/21 0512 03/09/21 0521  WBC 18.2* 10.8* 7.0 7.4  HGB 16.0* 12.8 13.0 12.6  HCT 46.4* 39.1 38.8 39.1  MCV 92.6 92.2 94.4 93.5  PLT 253 233 226 231   Cardiac Enzymes: No results for input(s): CKTOTAL, CKMB, CKMBINDEX, TROPONINI in the last 168 hours. BNP: Invalid input(s): POCBNP CBG: Recent Labs  Lab 03/08/21 1622 03/08/21 2020 03/08/21 2354 03/09/21 0406 03/09/21 0726  GLUCAP 183* 205* 176* 158* 128*   D-Dimer No results for input(s): DDIMER in the last 72 hours. Hgb A1c Recent Labs    03/06/21 1215  HGBA1C 7.7*   Lipid Profile No results for input(s): CHOL, HDL, LDLCALC, TRIG, CHOLHDL, LDLDIRECT in the last 72 hours. Thyroid function studies No results for input(s): TSH, T4TOTAL, T3FREE, THYROIDAB in the last 72 hours.  Invalid input(s): FREET3 Anemia work up No results for input(s): VITAMINB12, FOLATE, FERRITIN, TIBC, IRON, RETICCTPCT in the last 72 hours. Urinalysis No results found for: COLORURINE, APPEARANCEUR, [place], [place], GLUCOSEU, [place], [place], [place], PROTEINUR, UROBILINOGEN, NITRITE, LEUKOCYTESUR Sepsis Labs Invalid input(s): PROCALCITONIN,  WBC,  LACTICIDVEN Microbiology Recent Results (from the past 240 hour(s))  Resp Panel by RT-PCR (Flu A&B, Covid) Nasopharyngeal Swab     Status: None   Collection Time: 03/06/21  4:16 PM   Specimen: Nasopharyngeal Swab; Nasopharyngeal(NP) swabs in vial transport medium  Result Value Ref Range Status   SARS Coronavirus 2 by RT PCR NEGATIVE NEGATIVE Final    Comment: (NOTE) SARS-CoV-2 target nucleic acids are NOT DETECTED.  The SARS-CoV-2 RNA is generally detectable in upper respiratory specimens during the acute phase of infection. The lowest concentration of SARS-CoV-2 viral copies this assay can detect is 138 copies/mL. A negative result does not preclude SARS-Cov-2 infection and should not be used as the sole basis for treatment or other patient management  decisions. A negative result may occur with  improper specimen collection/handling, submission of specimen other than nasopharyngeal swab, presence of viral mutation(s) within the areas targeted by this assay, and inadequate number of viral copies(<138 copies/mL). A negative result must be combined with clinical observations, patient history, and epidemiological information. The expected result is Negative.  Fact Sheet for Patients:  EntrepreneurPulse.com.au  Fact Sheet for Healthcare Providers:  IncredibleEmployment.be  This test is no t yet approved or cleared by the [place] FDA and  has been authorized for detection and/or diagnosis of SARS-CoV-2 by FDA under an Emergency Use Authorization (EUA). This EUA will remain  in effect (meaning this test can be used) for the duration of the COVID-19 declaration under Section 564(b)(1) of the Act, 21 U.S.C.section 360bbb-3(b)(1), unless the authorization is terminated  or revoked sooner.       Influenza A by PCR NEGATIVE NEGATIVE Final   Influenza B by PCR NEGATIVE NEGATIVE Final    Comment: (NOTE) The Xpert Xpress SARS-CoV-2/FLU/RSV plus assay is intended as an aid in the diagnosis of influenza from Nasopharyngeal swab specimens and should not be used as a sole basis for treatment. Nasal washings and aspirates are unacceptable for Xpert Xpress SARS-CoV-2/FLU/RSV testing.  Fact Sheet for Patients: EntrepreneurPulse.com.au  Fact Sheet for Healthcare Providers: IncredibleEmployment.be  This test is not yet approved or cleared by the [place] FDA and has been authorized for detection and/or diagnosis of SARS-CoV-2 by FDA under an Emergency Use Authorization (EUA). This EUA will remain in effect (meaning this test can be used) for the duration of the COVID-19 declaration under Section 564(b)(1) of the Act, 21 U.S.C. section 360bbb-3(b)(1), unless the  authorization is terminated or revoked.  Performed at [facility], [address], [place], [place] [address]      Time coordinating discharge: 35 minutes  SIGNED:   Rodena Goldmann, DO [facility] 03/09/2021, 9:13 AM  If 7PM-7AM, please contact night-coverage [email or web address]

## 2022-10-03 ENCOUNTER — Encounter (HOSPITAL_COMMUNITY): Payer: Self-pay

## 2022-10-03 ENCOUNTER — Other Ambulatory Visit: Payer: Self-pay

## 2022-10-03 ENCOUNTER — Emergency Department (HOSPITAL_COMMUNITY)
Admission: EM | Admit: 2022-10-03 | Discharge: 2022-10-03 | Disposition: A | Discharge status: Home / Self Care | Payer: Medicare PPO | Attending: Emergency Medicine | Admitting: Emergency Medicine

## 2022-10-03 ENCOUNTER — Emergency Department (HOSPITAL_COMMUNITY): Payer: Medicare PPO

## 2022-10-03 DIAGNOSIS — Z79899 Other long term (current) drug therapy: Secondary | ICD-10-CM | POA: Diagnosis not present

## 2022-10-03 DIAGNOSIS — I10 Essential (primary) hypertension: Secondary | ICD-10-CM | POA: Diagnosis present

## 2022-10-03 LAB — BASIC METABOLIC PANEL
Anion gap: 11 (ref 5–15)
BUN: 19 mg/dL (ref 8–23)
CO2: 22 mmol/L (ref 22–32)
Calcium: 8.8 mg/dL — ABNORMAL LOW (ref 8.9–10.3)
Chloride: 103 mmol/L (ref 98–111)
Creatinine, Ser: 1 mg/dL (ref 0.44–1.00)
GFR, Estimated: >60 mL/min (ref >60)
Glucose, Bld: 94 mg/dL (ref 70–99)
Potassium: 4.6 mmol/L (ref 3.5–5.1)
Sodium: 136 mmol/L (ref 135–145)

## 2022-10-03 LAB — CBC
HCT: 39.8 % (ref 36.0–46.0)
Hemoglobin: 12.7 g/dL (ref 12.0–15.0)
MCH: 30.5 pg (ref 26.0–34.0)
MCHC: 31.9 g/dL (ref 30.0–36.0)
MCV: 95.7 fL (ref 80.0–100.0)
Platelets: 253 10*3/uL (ref 150–400)
RBC: 4.16 MIL/uL (ref 3.87–5.11)
RDW: 14.1 % (ref 11.5–15.5)
WBC: 9 10*3/uL (ref 4.0–10.5)
nRBC: 0 % (ref 0.0–0.2)

## 2022-10-03 NOTE — ED Provider Notes (Signed)
[facility] EMERGENCY DEPARTMENT AT [facility] Provider Note   CSN: [id number] Arrival date & time: 10/03/22  1110     History  Chief Complaint  Patient presents with   Hypertension    Maria Luna is a 62 y.o. female.  This is a 62 year old female is here today after she was sent by her PCP for high blood pressure.  Patient was having a visit for possible UTI.  She has not had any chest pain.  They sent her to the emergency department because her blood pressure was elevated.   Hypertension       Home Medications Prior to Admission medications   Medication Sig Start Date End Date Taking? Authorizing Provider  amphetamine-dextroamphetamine (ADDERALL) 10 MG tablet Take 10 mg by mouth 4 (four) times daily. 03/24/13   [provider]  FLUoxetine (PROZAC) 20 MG capsule Take 1 capsule by mouth daily. 09/18/20   [provider]  gabapentin (NEURONTIN) 100 MG capsule Take 1 capsule by mouth 3 (three) times daily. 06/18/20   [provider]  lisinopril (PRINIVIL,ZESTRIL) 10 MG tablet Take 10 mg by mouth daily. 03/23/13   [provider]  meloxicam (MOBIC) 7.5 MG tablet 1 po bid with food 04/14/13   Ivery Quale, PA-C  metFORMIN (GLUCOPHAGE-XR) 500 MG 24 hr tablet Take 1,000 mg by mouth 2 (two) times daily. 03/24/13   [provider]  ondansetron (ZOFRAN) 4 MG tablet Take 1 tablet (4 mg total) by mouth every 6 (six) hours as needed for nausea. 03/09/21   Sherryll Burger, Pratik D, DO  rosuvastatin (CRESTOR) 10 MG tablet Take by mouth. 02/14/21   [provider]      Allergies    Iohexol    Review of Systems   Review of Systems  Physical Exam Updated Vital Signs BP (!) 172/84   Pulse 68   Temp 98.2 F (36.8 C) (Oral)   Resp (!) 22   Ht 5\' 2"  (1.575 m)   Wt 108.9 kg   SpO2 100%   BMI 43.90 kg/m  Physical Exam Vitals reviewed.  HENT:     Head: Normocephalic and atraumatic.  Cardiovascular:     Rate and Rhythm: Normal  rate.  Pulmonary:     Effort: Pulmonary effort is normal.     ED Results / Procedures / Treatments   Labs (all labs ordered are listed, but only abnormal results are displayed) Labs Reviewed  BASIC METABOLIC PANEL - Abnormal; Notable for the following components:      Result Value   Calcium 8.8 (*)    All other components within normal limits  CBC    EKG EKG Interpretation Date/Time:  Friday October 03 2022 12:17:05 EDT Ventricular Rate:  71 PR Interval:  138 QRS Duration:  72 QT Interval:  404 QTC Calculation: 439 R Axis:   61  Text Interpretation: Normal sinus rhythm Normal ECG When compared with ECG of 06-Mar-2021 18:52, No significant change since last tracing Confirmed by Linwood Dibbles [phone]) on 10/03/2022 2:04:03 PM  Radiology DG Chest 2 View  Result Date: 10/03/2022 CLINICAL DATA:  chest pain EXAM: CHEST - 2 VIEW COMPARISON:  None Available. FINDINGS: Bilateral lung fields are clear. Note is made of elevated right hemidiaphragm. Bilateral costophrenic angles are clear. Normal cardio-mediastinal silhouette. No acute osseous abnormalities. The soft tissues are within normal limits. There are multiple surgical staples overlying the right upper abdomen. IMPRESSION: Elevated right hemidiaphragm. No focal pulmonary consolidation. Electronically Signed   By: Rhea Belton  Ramiro Harvest M.D.   On: 10/03/2022 13:42    Procedures Procedures    Medications Ordered in ED Medications - No data to display  ED Course/ Medical Decision Making/ A&P                                 Medical Decision Making 62 year old female here today for hypertension.  Differential diagnose include chronic hypertension, benign hypertension of.  Plan # my dependent review the patient's EKG shows no ST segment depressions or elevation, no T wave inversions.  There is no evidence of acute ischemia on the patient's EKG.  She does not have any chest pain.  Do not believe patient requires troponin testing for benign  hypertension.  She does not have headache.  Patient currently taking 10 mg of lisinopril.  Will instruct her to increase her dose to 20 mg daily.  Patient had labs drawn at triage which included a CBC and a BMP.  Will await those.  Reassessment-normal kidney function, no anemia.  Discharge home.  Amount and/or Complexity of Data Reviewed Labs: ordered. Decision-making details documented in ED Course. Radiology: ordered.           Final Clinical Impression(s) / ED Diagnoses Final diagnoses:  Primary hypertension    Rx / DC Orders ED Discharge Orders     None         Anders Simmonds T, DO 10/03/22 1517

## 2022-10-03 NOTE — ED Triage Notes (Addendum)
Pt was seen by PCP today and noted to have an elevated B/P. Pt reports it was 190/110 in the office and was told to come to the ED. Pt denies CP, ShOB, or HA. Pt was told the PCP saw changes on the EKG.

## 2022-10-03 NOTE — Discharge Instructions (Addendum)
Begin taking 20 mg of lisinopril each day.  You can take 2 of your 10 mg pills in the morning.  Follow-up with your primary care doctor within 1 week for further management of your hypertension.

## 2022-10-03 NOTE — ED Notes (Signed)
Pt went to X-ray.   

## 2022-10-03 NOTE — ED Notes (Signed)
Dc instructions reviewed with pt will follow up with pcp

## 2022-10-13 ENCOUNTER — Encounter: Payer: Self-pay | Admitting: Family Medicine

## 2022-11-14 ENCOUNTER — Other Ambulatory Visit: Payer: Self-pay | Admitting: Family Medicine

## 2022-11-14 DIAGNOSIS — Z1231 Encounter for screening mammogram for malignant neoplasm of breast: Secondary | ICD-10-CM

## 2022-12-03 ENCOUNTER — Ambulatory Visit
Admission: RE | Admit: 2022-12-03 | Discharge: 2022-12-03 | Disposition: A | Discharge status: Home / Self Care | Payer: Medicare PPO | Source: Ambulatory Visit | Attending: Family Medicine | Admitting: Family Medicine

## 2022-12-03 DIAGNOSIS — Z1231 Encounter for screening mammogram for malignant neoplasm of breast: Secondary | ICD-10-CM

## 2024-01-12 ENCOUNTER — Other Ambulatory Visit: Payer: Self-pay | Admitting: Family Medicine

## 2024-01-12 DIAGNOSIS — Z1231 Encounter for screening mammogram for malignant neoplasm of breast: Secondary | ICD-10-CM

## 2024-02-10 ENCOUNTER — Ambulatory Visit
Admission: RE | Admit: 2024-02-10 | Discharge: 2024-02-10 | Disposition: A | Discharge status: Home / Self Care | Source: Ambulatory Visit | Attending: Family Medicine | Admitting: Family Medicine

## 2024-02-10 ENCOUNTER — Ambulatory Visit

## 2024-02-10 DIAGNOSIS — Z1231 Encounter for screening mammogram for malignant neoplasm of breast: Secondary | ICD-10-CM
